# Patient Record
Sex: Female | Born: 1985 | Race: White | Hispanic: No | Marital: Single | Smoking: Current some day smoker
Health system: Southern US, Community
[De-identification: ages and names within clinical notes are randomized; demographics above are authoritative.]

## PROBLEM LIST (undated history)

## (undated) DIAGNOSIS — B1920 Unspecified viral hepatitis C without hepatic coma: Secondary | ICD-10-CM

## (undated) DIAGNOSIS — K219 Gastro-esophageal reflux disease without esophagitis: Secondary | ICD-10-CM

## (undated) DIAGNOSIS — IMO0001 Reserved for inherently not codable concepts without codable children: Secondary | ICD-10-CM

## (undated) DIAGNOSIS — M7918 Myalgia, other site: Secondary | ICD-10-CM

## (undated) DIAGNOSIS — N200 Calculus of kidney: Secondary | ICD-10-CM

## (undated) DIAGNOSIS — S329XXA Fracture of unspecified parts of lumbosacral spine and pelvis, initial encounter for closed fracture: Secondary | ICD-10-CM

## (undated) DIAGNOSIS — F419 Anxiety disorder, unspecified: Secondary | ICD-10-CM

## (undated) DIAGNOSIS — S42301A Unspecified fracture of shaft of humerus, right arm, initial encounter for closed fracture: Secondary | ICD-10-CM

## (undated) DIAGNOSIS — C539 Malignant neoplasm of cervix uteri, unspecified: Secondary | ICD-10-CM

## (undated) DIAGNOSIS — G510 Bell's palsy: Secondary | ICD-10-CM

## (undated) DIAGNOSIS — N390 Urinary tract infection, site not specified: Secondary | ICD-10-CM

## (undated) DIAGNOSIS — Z5189 Encounter for other specified aftercare: Secondary | ICD-10-CM

## (undated) DIAGNOSIS — S31809A Unspecified open wound of unspecified buttock, initial encounter: Secondary | ICD-10-CM

## (undated) HISTORY — PX: HUMERUS FRACTURE SURGERY: SHX670

## (undated) HISTORY — PX: FRACTURE SURGERY: SHX138

## (undated) HISTORY — PX: BONY PELVIS SURGERY: SHX572

## (undated) HISTORY — PX: HIP SURGERY: SHX245

## (undated) HISTORY — PX: ANKLE ARTHROPLASTY: SUR68

## (undated) HISTORY — DX: Unspecified viral hepatitis C without hepatic coma: B19.20

---
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
## 2006-09-14 ENCOUNTER — Encounter: Payer: Self-pay | Admitting: Gynecologic Oncology

## 2006-09-14 ENCOUNTER — Ambulatory Visit: Admission: RE | Admit: 2006-09-14 | Discharge: 2006-09-14 | Payer: Self-pay | Admitting: Gynecologic Oncology

## 2006-09-14 ENCOUNTER — Other Ambulatory Visit: Admission: RE | Admit: 2006-09-14 | Discharge: 2006-09-14 | Payer: Self-pay | Admitting: Gynecologic Oncology

## 2007-06-10 ENCOUNTER — Emergency Department (HOSPITAL_COMMUNITY): Admission: EM | Admit: 2007-06-10 | Discharge: 2007-06-10 | Payer: Self-pay | Admitting: Emergency Medicine

## 2007-06-13 ENCOUNTER — Emergency Department (HOSPITAL_COMMUNITY): Admission: EM | Admit: 2007-06-13 | Discharge: 2007-06-13 | Payer: Self-pay | Admitting: Emergency Medicine

## 2007-12-19 ENCOUNTER — Emergency Department (HOSPITAL_COMMUNITY): Admission: EM | Admit: 2007-12-19 | Discharge: 2007-12-19 | Payer: Self-pay | Admitting: Emergency Medicine

## 2008-03-30 DIAGNOSIS — C539 Malignant neoplasm of cervix uteri, unspecified: Secondary | ICD-10-CM

## 2008-03-30 HISTORY — PX: ABDOMINAL HYSTERECTOMY: SHX81

## 2008-03-30 HISTORY — DX: Malignant neoplasm of cervix uteri, unspecified: C53.9

## 2008-05-21 ENCOUNTER — Emergency Department (HOSPITAL_COMMUNITY): Admission: EM | Admit: 2008-05-21 | Discharge: 2008-05-21 | Payer: Self-pay | Admitting: Emergency Medicine

## 2008-05-21 ENCOUNTER — Encounter: Payer: Self-pay | Admitting: Orthopedic Surgery

## 2008-05-26 ENCOUNTER — Emergency Department (HOSPITAL_COMMUNITY): Admission: EM | Admit: 2008-05-26 | Discharge: 2008-05-26 | Payer: Self-pay | Admitting: Emergency Medicine

## 2008-05-28 ENCOUNTER — Ambulatory Visit: Payer: Self-pay | Admitting: Orthopedic Surgery

## 2008-05-28 DIAGNOSIS — S3210XA Unspecified fracture of sacrum, initial encounter for closed fracture: Secondary | ICD-10-CM | POA: Insufficient documentation

## 2008-05-28 DIAGNOSIS — S322XXA Fracture of coccyx, initial encounter for closed fracture: Secondary | ICD-10-CM

## 2008-06-11 ENCOUNTER — Telehealth: Payer: Self-pay | Admitting: Orthopedic Surgery

## 2008-06-18 ENCOUNTER — Emergency Department (HOSPITAL_COMMUNITY): Admission: EM | Admit: 2008-06-18 | Discharge: 2008-06-18 | Payer: Self-pay | Admitting: Emergency Medicine

## 2008-07-17 ENCOUNTER — Encounter (INDEPENDENT_AMBULATORY_CARE_PROVIDER_SITE_OTHER): Payer: Self-pay | Admitting: Unknown Physician Specialty

## 2008-07-17 ENCOUNTER — Other Ambulatory Visit: Admission: RE | Admit: 2008-07-17 | Discharge: 2008-07-17 | Payer: Self-pay | Admitting: Unknown Physician Specialty

## 2008-07-30 ENCOUNTER — Telehealth: Payer: Self-pay | Admitting: Orthopedic Surgery

## 2008-07-30 ENCOUNTER — Emergency Department (HOSPITAL_COMMUNITY): Admission: EM | Admit: 2008-07-30 | Discharge: 2008-07-30 | Payer: Self-pay | Admitting: Emergency Medicine

## 2008-08-01 ENCOUNTER — Ambulatory Visit: Payer: Self-pay | Admitting: Orthopedic Surgery

## 2008-08-06 ENCOUNTER — Encounter (INDEPENDENT_AMBULATORY_CARE_PROVIDER_SITE_OTHER): Payer: Self-pay | Admitting: *Deleted

## 2008-09-18 ENCOUNTER — Encounter: Payer: Self-pay | Admitting: Orthopedic Surgery

## 2009-03-07 ENCOUNTER — Ambulatory Visit (HOSPITAL_COMMUNITY): Admission: RE | Admit: 2009-03-07 | Discharge: 2009-03-08 | Payer: Self-pay | Admitting: Obstetrics and Gynecology

## 2009-03-07 ENCOUNTER — Encounter: Payer: Self-pay | Admitting: Obstetrics and Gynecology

## 2009-04-12 ENCOUNTER — Emergency Department (HOSPITAL_COMMUNITY): Admission: EM | Admit: 2009-04-12 | Discharge: 2009-04-13 | Payer: Self-pay | Admitting: Emergency Medicine

## 2009-05-28 ENCOUNTER — Emergency Department (HOSPITAL_COMMUNITY): Admission: EM | Admit: 2009-05-28 | Discharge: 2009-05-28 | Payer: Self-pay | Admitting: Emergency Medicine

## 2009-11-04 ENCOUNTER — Emergency Department (HOSPITAL_COMMUNITY): Admission: EM | Admit: 2009-11-04 | Discharge: 2009-11-05 | Payer: Self-pay | Admitting: Emergency Medicine

## 2010-04-29 NOTE — Assessment & Plan Note (Signed)
Summary: [facility] FX COCCYX/XR THERE/AWARE TO PAY $100/BSF   Vital Signs:  Patient Profile:   25 Years Old Female Weight:      133 pounds Pulse rate:   88 / minute Resp:     18 per minute  Vitals Entered By: Fuller Canada MD (May 28, 2008 1:53 PM)                 Chief Complaint:  pain in tail bone.  History of Present Illness: I saw [name] in the office today for an initial visit.  She is a 25 years old woman with the complaint of:  pain in coccyx area after falling on 05/21/08., she fell down steps. Xrays [facility] 05/21/08.  Medication oxycodone 5/325 mg and Ibuprofen 800mg  two times a day  Has alot of pain and pressure in butt area.  Hurts to sit and stand.  bowel movement hurt.         Updated Prior Medication List: prilosec albuterol inhaler oxycodone Current Allergies: ! * ALEVE ! * EXCEDRIN ! FLAGYL  Past Medical History:    cervical cancer    asthma    acid reflux  Past Surgical History:    NA   Family History:    FH of Cancer:     Hx, family, asthma  Social History:    Patient is single.     unemployed   Risk Factors:  Tobacco use:  current    Cigarettes:  Yes -- 1 pack(s) per day Caffeine use:  2 drinks per day Alcohol use:  yes    Comments:  occasional   Review of Systems  General      Denies weight loss, weight gain, fever, chills, and fatigue.  Cardiac      Denies chest pain, angina, heart attack, heart failure, poor circulation, blood clots, and phlebitis.  Resp      Denies short of breath, difficulty breathing, COPD, cough, and pneumonia.  GI      Complains of nausea, vomiting, diarrhea, and reflux.      Denies constipation, difficulty swallowing, ulcers, and GERD.  GU      Denies kidney failure, kidney transplant, kidney stones, burning, poor stream, testicular cancer, blood in urine, and .  Neuro      Complains of numbness.      Denies headache, dizziness, migraines, weakness, tremor, and unsteady walking.   MS      Denies joint pain, rheumatoid arthritis, joint swelling, gout, bone cancer, osteoporosis, and .  Endo      Denies thyroid disease, goiter, and diabetes.  Psych      Complains of anxiety.      Denies depression, mood swings, panic attack, bipolar, and schizophrenia.  Derm      Denies eczema, cancer, and itching.  EENT      Denies poor vision, cataracts, glaucoma, poor hearing, vertigo, ears ringing, sinusitis, hoarseness, toothaches, and bleeding gums.  Immunology      Denies seasonal allergies, sinus problems, and allergic to bee stings.  Lymphatic      Denies lymph node cancer and lymph edema.   Physical Exam  Constitutional: normal appearance   CDV: normal pulse and perfusion  Skin: normal  Neuro: normal sensation  Psyche: awake and alert  MSK:  normal motor function in the lower extremities, normal strength, normal range of motion, no tenderness, all joints are stable  Tenderness in the lower part of the back and in the tailbone area.  Medications Added to Medication List This Visit: 1)  Oxycodone-acetaminophen 5-325 Mg Tabs (Oxycodone-acetaminophen) .... One by mouth q 4 hrs as needed pain 2)  Percocet 5-325 Mg Tabs (Oxycodone-acetaminophen) .Marland Kitchen.. 1 by mouth q 4 as needed pain   Patient Instructions: 1)  Take the percocet for 2 weeks then lorcet until pain resides  2)  probably take 8 weeks 3)  Get donut cushion from [facility] to sit on    Prescriptions: PERCOCET 5-325 MG TABS (OXYCODONE-ACETAMINOPHEN) 1 by mouth q 4 as needed pain  #84 x 0   Entered and Authorized by:   Fuller Canada MD   Signed by:   Fuller Canada MD on 05/28/2008   Method used:   Print then Give to Patient   RxID:   [id number]

## 2010-04-29 NOTE — Letter (Signed)
Summary: Historic Patient File  Historic Patient File   Imported By: Elvera Maria 05/29/2008 09:35:59  _____________________________________________________________________  External Attachment:    Type:   Image     Comment:   history form

## 2010-04-29 NOTE — Progress Notes (Signed)
Summary: wants Lorcet prescription  Phone Note Call from Patient   Complaint: Urinary/GYN Problems Summary of Call: [name] (06-28-1985) has finished the percocet prescription and needs one for Lorcet.  Uses [facility] Her # is [phone] Initial call taken by: Jacklynn Ganong,  June 11, 2008 9:34 AM  Follow-up for Phone Call        lorcet plus 1 q 4 as needed pain # 60 2 refills  Follow-up by: Fuller Canada MD,  June 11, 2008 9:36 AM  Additional Follow-up for Phone Call Additional follow up Details #1::        sent to [facility] to call to pharmacy Additional Follow-up by: Jacklynn Ganong,  June 11, 2008 9:47 AM    New/Updated Medications: LORCET PLUS 7.5-650 MG TABS (HYDROCODONE-ACETAMINOPHEN) one by mouth q 4 hrs as needed pain   Prescriptions: LORCET PLUS 7.5-650 MG TABS (HYDROCODONE-ACETAMINOPHEN) one by mouth q 4 hrs as needed pain  #60 x 2   Entered by:   Ether Griffins   Authorized by:   Fuller Canada MD   Signed by:   Ether Griffins on 07/09/2008   Method used:   Telephoned to ...         RxID:   [id number]

## 2010-04-29 NOTE — Letter (Signed)
Summary: Certified Letter Unclaimed  Certified Letter Unclaimed   Imported By: Cammie Sickle 09/20/2008 19:44:01  _____________________________________________________________________  External Attachment:    Type:   Image     Comment:   External Document

## 2010-04-29 NOTE — Progress Notes (Signed)
Summary: wants you to change her pain medicine  Phone Note Call from Patient   Summary of Call: Kennia Klosinski went to [facility] this am for the tailbone pain, says it has gotten worse.  Said the ER doctor would not give her anything for pain, told her to see you.  she has an appointment  here this Wednesday.  She wants to know if you will give her something other than the hydrocodone 7.5/650, says it does not help the pain at all.  Uses [facility]. Her # is [phone] Initial call taken by: Jacklynn Ganong,  Jul 30, 2008 2:55 PM  Follow-up for Phone Call        no  Follow-up by: Fuller Canada MD,  Jul 30, 2008 3:06 PM  Additional Follow-up for Phone Call Additional follow up Details #1::        advised patient. Additional Follow-up by: Cammie Sickle,  Jul 30, 2008 5:12 PM

## 2010-04-29 NOTE — Assessment & Plan Note (Signed)
Summary: [facility] er tail bone pain/self pay/bsf   History of Present Illness: I saw [name] in the office today for a followup visit.  She is a 25 years old woman with the complaint of:  tailbone pain.  05/21/08 fell down steps  She was treated with usual measures of pain medication and education.  She fell again and went to the emergency room a third x-rays taken of her hip and pelvis were normal.  She was placed on Robaxin and prednisone one twice a day no pain medicines were given.  Obviously he has been almost 3 months.  She continues to complain of pain and now the pain is worse in the Lorcet Plus does not help.  She also fell again in April.    HISTORY I saw [name] in the office today for an initial visit.  She is a 25 years old woman with the complaint of:  pain in coccyx area after falling on 05/21/08., she fell down steps. Xrays [facility] 05/21/08.  Medication oxycodone 5/325 mg and Ibuprofen 800mg  two times a day  Has alot of pain and pressure in butt area.  Hurts to sit and stand.  bowel movement hurt.       Current Medications (verified): 1)  Lorcet Plus 7.5-650 Mg Tabs (Hydrocodone-Acetaminophen) .... One By Mouth Q 4 Hrs As Needed Pain 2)  Robaxin 500 Mg Tabs (Methocarbamol) .... 2 By Mouth Bid 3)  Dexamethasone 4 Mg Tabs (Dexamethasone) .... One By Mouth Bid 4)  Robaxin 500 Mg Tabs (Methocarbamol) .Marland Kitchen.. 1 By Mouth Q 6 As Needed Pain or Spasms 5)  Ultracet 37.5-325 Mg Tabs (Tramadol-Acetaminophen) .Marland Kitchen.. 1-2 By Mouth Q 4 As Needed Pain  Allergies (verified): 1)  ! * Aleve 2)  ! * Excedrin 3)  ! Flagyl  Past History:  Past Medical History:    cervical cancer    asthma    acid reflux (05/28/2008)  Past Surgical History:    NA (05/28/2008)  Family History:    FH of Cancer:     Hx, family, asthma     (05/28/2008)  Social History:    Patient is single.     unemployed (05/28/2008)  Risk Factors:    Alcohol Use: N/A    >5 drinks/d w/in last 3 months: N/A     Caffeine Use: 2 (05/28/2008)    Diet: N/A    Exercise: N/A  Risk Factors:    Smoking Status: current (05/28/2008)    Packs/Day: 1 (05/28/2008)    Cigars/wk: N/A    Pipe Use/wk: N/A    Cans of tobacco/wk: N/A    Passive Smoke Exposure: N/A  Review of Systems Neuro:  See HPI. MS:  See HPI.  Physical Exam  Additional Exam:  Examination reveals a well-developed well-nourished female grooming and hygiene are normal body habitus small to medium  Spinal tenderness is noted in the lumbosacral region and also at the tailbone as well as somewhat in the thoracic region.  There is no deformity or step-off.  Neurovascular exam is intact.   Impression & Recommendations:  Problem # 1:  FRACTURE, COCCYX (ICD-805.6) Assessment Comment Only  Assessment:  I reviewed her regional injury x-ray and her new x-rays and there is a flexion position of the coccyx normal hip and pelvic film otherwise.  I don't think she needs any more pain medication that his narcotic.  She can take Robaxin and Ultracet.  She has chronic pain we offered her the pain clinic she denied need  for pain clinic and requested half of her payment back.  We gave her that back and we are discharging her from the practice.  Appropriate level be forwarded to her home address.  Orders: Pain Clinic Referral (Pain) Est. Patient Level II ([address])  Medications Added to Medication List This Visit: 1)  Robaxin 500 Mg Tabs (Methocarbamol) .... 2 by mouth bid 2)  Dexamethasone 4 Mg Tabs (Dexamethasone) .... One by mouth bid 3)  Robaxin 500 Mg Tabs (Methocarbamol) .Marland Kitchen.. 1 by mouth q 6 as needed pain or spasms 4)  Ultracet 37.5-325 Mg Tabs (Tramadol-acetaminophen) .Marland Kitchen.. 1-2 by mouth q 4 as needed pain  Patient Instructions: 1)  Pain clinic referral for chronic pain 2)  Stop prednisone  3)  Continue robaxin, and you can take ultracet for pain  Prescriptions: ULTRACET 37.5-325 MG TABS (TRAMADOL-ACETAMINOPHEN) 1-2 by mouth q 4 as needed  pain  #60 x 3   Entered and Authorized by:   Fuller Canada MD   Signed by:   Fuller Canada MD on 08/01/2008   Method used:   Print then Give to Patient   RxID:   [id number] ROBAXIN 500 MG TABS (METHOCARBAMOL) 1 by mouth q 6 as needed pain or spasms  #60 x 1   Entered and Authorized by:   Fuller Canada MD   Signed by:   Fuller Canada MD on 08/01/2008   Method used:   Print then Give to Patient   RxID:   [id number]

## 2010-06-25 ENCOUNTER — Encounter (HOSPITAL_COMMUNITY): Payer: Self-pay | Admitting: Radiology

## 2010-06-25 ENCOUNTER — Inpatient Hospital Stay (HOSPITAL_COMMUNITY)
Admission: EM | Admit: 2010-06-25 | Discharge: 2010-08-04 | DRG: 958 | Disposition: A | Discharge status: Home Health | Payer: No Typology Code available for payment source | Attending: General Surgery | Admitting: General Surgery

## 2010-06-25 ENCOUNTER — Emergency Department (HOSPITAL_COMMUNITY): Payer: No Typology Code available for payment source

## 2010-06-25 DIAGNOSIS — S85309A Unspecified injury of greater saphenous vein at lower leg level, unspecified leg, initial encounter: Secondary | ICD-10-CM | POA: Diagnosis present

## 2010-06-25 DIAGNOSIS — I959 Hypotension, unspecified: Secondary | ICD-10-CM | POA: Diagnosis present

## 2010-06-25 DIAGNOSIS — S31809A Unspecified open wound of unspecified buttock, initial encounter: Secondary | ICD-10-CM | POA: Diagnosis present

## 2010-06-25 DIAGNOSIS — S32509A Unspecified fracture of unspecified pubis, initial encounter for closed fracture: Secondary | ICD-10-CM | POA: Diagnosis present

## 2010-06-25 DIAGNOSIS — Y849 Medical procedure, unspecified as the cause of abnormal reaction of the patient, or of later complication, without mention of misadventure at the time of the procedure: Secondary | ICD-10-CM | POA: Diagnosis present

## 2010-06-25 DIAGNOSIS — S71109A Unspecified open wound, unspecified thigh, initial encounter: Secondary | ICD-10-CM | POA: Diagnosis present

## 2010-06-25 DIAGNOSIS — S8253XA Displaced fracture of medial malleolus of unspecified tibia, initial encounter for closed fracture: Secondary | ICD-10-CM | POA: Diagnosis present

## 2010-06-25 DIAGNOSIS — R791 Abnormal coagulation profile: Secondary | ICD-10-CM | POA: Diagnosis present

## 2010-06-25 DIAGNOSIS — S32309B Unspecified fracture of unspecified ilium, initial encounter for open fracture: Secondary | ICD-10-CM | POA: Diagnosis present

## 2010-06-25 DIAGNOSIS — E876 Hypokalemia: Secondary | ICD-10-CM | POA: Diagnosis not present

## 2010-06-25 DIAGNOSIS — M24529 Contracture, unspecified elbow: Secondary | ICD-10-CM | POA: Diagnosis present

## 2010-06-25 DIAGNOSIS — N390 Urinary tract infection, site not specified: Secondary | ICD-10-CM | POA: Diagnosis not present

## 2010-06-25 DIAGNOSIS — S300XXA Contusion of lower back and pelvis, initial encounter: Secondary | ICD-10-CM | POA: Diagnosis present

## 2010-06-25 DIAGNOSIS — K5900 Constipation, unspecified: Secondary | ICD-10-CM | POA: Diagnosis not present

## 2010-06-25 DIAGNOSIS — S42309A Unspecified fracture of shaft of humerus, unspecified arm, initial encounter for closed fracture: Secondary | ICD-10-CM | POA: Diagnosis present

## 2010-06-25 DIAGNOSIS — S36113A Laceration of liver, unspecified degree, initial encounter: Principal | ICD-10-CM | POA: Diagnosis present

## 2010-06-25 DIAGNOSIS — S75109A Unspecified injury of femoral vein at hip and thigh level, unspecified leg, initial encounter: Secondary | ICD-10-CM | POA: Diagnosis present

## 2010-06-25 DIAGNOSIS — J45909 Unspecified asthma, uncomplicated: Secondary | ICD-10-CM | POA: Diagnosis present

## 2010-06-25 DIAGNOSIS — Y9210 Unspecified residential institution as the place of occurrence of the external cause: Secondary | ICD-10-CM | POA: Diagnosis present

## 2010-06-25 DIAGNOSIS — F411 Generalized anxiety disorder: Secondary | ICD-10-CM | POA: Diagnosis present

## 2010-06-25 DIAGNOSIS — F17200 Nicotine dependence, unspecified, uncomplicated: Secondary | ICD-10-CM | POA: Diagnosis present

## 2010-06-25 DIAGNOSIS — D62 Acute posthemorrhagic anemia: Secondary | ICD-10-CM | POA: Diagnosis present

## 2010-06-25 DIAGNOSIS — S7000XA Contusion of unspecified hip, initial encounter: Secondary | ICD-10-CM | POA: Diagnosis present

## 2010-06-25 DIAGNOSIS — IMO0002 Reserved for concepts with insufficient information to code with codable children: Secondary | ICD-10-CM | POA: Diagnosis present

## 2010-06-25 DIAGNOSIS — S42409A Unspecified fracture of lower end of unspecified humerus, initial encounter for closed fracture: Secondary | ICD-10-CM | POA: Diagnosis present

## 2010-06-25 DIAGNOSIS — S42413A Displaced simple supracondylar fracture without intercondylar fracture of unspecified humerus, initial encounter for closed fracture: Secondary | ICD-10-CM | POA: Diagnosis present

## 2010-06-25 DIAGNOSIS — Y998 Other external cause status: Secondary | ICD-10-CM

## 2010-06-25 DIAGNOSIS — S71009A Unspecified open wound, unspecified hip, initial encounter: Secondary | ICD-10-CM | POA: Diagnosis present

## 2010-06-25 LAB — POCT I-STAT, CHEM 8
BUN: 4 mg/dL — ABNORMAL LOW (ref 6–23)
Calcium, Ion: 0.86 mmol/L — ABNORMAL LOW (ref 1.12–1.32)
Chloride: 109 mEq/L (ref 96–112)
Creatinine, Ser: 0.9 mg/dL (ref 0.4–1.2)
Glucose, Bld: 122 mg/dL — ABNORMAL HIGH (ref 70–99)
HCT: 37 % (ref 36.0–46.0)
Hemoglobin: 12.6 g/dL (ref 12.0–15.0)
Potassium: 3.6 mEq/L (ref 3.5–5.1)
Sodium: 141 mEq/L (ref 135–145)
TCO2: 15 mmol/L (ref 0–100)

## 2010-06-25 LAB — COMPREHENSIVE METABOLIC PANEL
ALT: 84 U/L — ABNORMAL HIGH (ref 0–35)
AST: 136 U/L — ABNORMAL HIGH (ref 0–37)
Albumin: 2.5 g/dL — ABNORMAL LOW (ref 3.5–5.2)
Alkaline Phosphatase: 42 U/L (ref 39–117)
BUN: 5 mg/dL — ABNORMAL LOW (ref 6–23)
CO2: 23 meq/L (ref 19–32)
Calcium: 6.6 mg/dL — ABNORMAL LOW (ref 8.4–10.5)
Chloride: 115 mEq/L — ABNORMAL HIGH (ref 96–112)
Creatinine, Ser: 0.63 mg/dL (ref 0.4–1.2)
GFR calc Af Amer: >60 mL/min (ref >60)
GFR calc non Af Amer: >60 mL/min (ref >60)
Glucose, Bld: 130 mg/dL — ABNORMAL HIGH (ref 70–99)
Potassium: 3.9 mEq/L (ref 3.5–5.1)
Sodium: 143 mEq/L (ref 135–145)
Total Bilirubin: 0.2 mg/dL — ABNORMAL LOW (ref 0.3–1.2)
Total Protein: 4.3 g/dL — ABNORMAL LOW (ref 6.0–8.3)

## 2010-06-25 LAB — DIFFERENTIAL
Basophils Absolute: 0 10*3/uL (ref 0.0–0.1)
Basophils Relative: 0 % (ref 0–1)
Eosinophils Absolute: 0.4 10*3/uL (ref 0.0–0.7)
Eosinophils Relative: 2 % (ref 0–5)
Lymphocytes Relative: 26 % (ref 12–46)
Lymphs Abs: 5.4 10*3/uL — ABNORMAL HIGH (ref 0.7–4.0)
Monocytes Absolute: 0.8 10*3/uL (ref 0.1–1.0)
Monocytes Relative: 4 % (ref 3–12)
Neutro Abs: 14 10*3/uL — ABNORMAL HIGH (ref 1.7–7.7)
Neutrophils Relative %: 68 % (ref 43–77)

## 2010-06-25 LAB — POCT I-STAT 4, (NA,K, GLUC, HGB,HCT)
Glucose, Bld: 125 mg/dL — ABNORMAL HIGH (ref 70–99)
HCT: 27 % — ABNORMAL LOW (ref 36.0–46.0)
Hemoglobin: 9.2 g/dL — ABNORMAL LOW (ref 12.0–15.0)
Potassium: 4.2 meq/L (ref 3.5–5.1)
Sodium: 146 mEq/L — ABNORMAL HIGH (ref 135–145)

## 2010-06-25 LAB — ABO/RH: ABO/RH(D): B POS

## 2010-06-25 LAB — ETHANOL: Alcohol, Ethyl (B): 57 mg/dL — ABNORMAL HIGH (ref 0–10)

## 2010-06-25 LAB — CBC
HCT: 36.6 % (ref 36.0–46.0)
Hemoglobin: 12.4 g/dL (ref 12.0–15.0)
MCH: 32.7 pg (ref 26.0–34.0)
MCHC: 33.9 g/dL (ref 30.0–36.0)
MCV: 96.6 fL (ref 78.0–100.0)
Platelets: 272 10*3/uL (ref 150–400)
RBC: 3.79 MIL/uL — ABNORMAL LOW (ref 3.87–5.11)
RDW: 12.7 % (ref 11.5–15.5)
WBC: 20.6 10*3/uL — ABNORMAL HIGH (ref 4.0–10.5)

## 2010-06-25 LAB — LACTIC ACID, PLASMA: Lactic Acid, Venous: 3 mmol/L — ABNORMAL HIGH (ref 0.5–2.2)

## 2010-06-25 MED ORDER — IOHEXOL 300 MG/ML  SOLN
100.0000 mL | Freq: Once | INTRAMUSCULAR | Status: AC | PRN
  Administered 2010-06-25: 100 mL via INTRAVENOUS

## 2010-06-26 ENCOUNTER — Inpatient Hospital Stay (HOSPITAL_COMMUNITY): Payer: No Typology Code available for payment source

## 2010-06-26 LAB — BASIC METABOLIC PANEL
BUN: 6 mg/dL (ref 6–23)
CO2: 24 mEq/L (ref 19–32)
Calcium: 7.1 mg/dL — ABNORMAL LOW (ref 8.4–10.5)
Chloride: 113 mEq/L — ABNORMAL HIGH (ref 96–112)
Creatinine, Ser: 0.69 mg/dL (ref 0.4–1.2)
GFR calc Af Amer: >60 mL/min (ref >60)
GFR calc non Af Amer: >60 mL/min (ref >60)
Glucose, Bld: 139 mg/dL — ABNORMAL HIGH (ref 70–99)
Potassium: 3.8 mEq/L (ref 3.5–5.1)
Sodium: 144 mEq/L (ref 135–145)

## 2010-06-26 LAB — MRSA PCR SCREENING: MRSA by PCR: NEGATIVE

## 2010-06-26 LAB — CBC
HCT: 28.8 % — ABNORMAL LOW (ref 36.0–46.0)
HCT: 26.1 % — ABNORMAL LOW (ref 36.0–46.0)
Hemoglobin: 9.8 g/dL — ABNORMAL LOW (ref 12.0–15.0)
Hemoglobin: 8.8 g/dL — ABNORMAL LOW (ref 12.0–15.0)
MCH: 33.2 pg (ref 26.0–34.0)
MCH: 32.7 pg (ref 26.0–34.0)
MCHC: 34 g/dL (ref 30.0–36.0)
MCHC: 33.7 g/dL (ref 30.0–36.0)
MCV: 97.6 fL (ref 78.0–100.0)
MCV: 97 fL (ref 78.0–100.0)
Platelets: 194 10*3/uL (ref 150–400)
Platelets: 173 10*3/uL (ref 150–400)
RBC: 2.95 MIL/uL — ABNORMAL LOW (ref 3.87–5.11)
RBC: 2.69 MIL/uL — ABNORMAL LOW (ref 3.87–5.11)
RDW: 13.1 % (ref 11.5–15.5)
RDW: 13.2 % (ref 11.5–15.5)
WBC: 12.7 10*3/uL — ABNORMAL HIGH (ref 4.0–10.5)
WBC: 9.6 10*3/uL (ref 4.0–10.5)

## 2010-06-27 ENCOUNTER — Inpatient Hospital Stay (HOSPITAL_COMMUNITY): Payer: No Typology Code available for payment source

## 2010-06-27 DIAGNOSIS — S75009A Unspecified injury of femoral artery, unspecified leg, initial encounter: Secondary | ICD-10-CM

## 2010-06-27 LAB — BASIC METABOLIC PANEL
BUN: 2 mg/dL — ABNORMAL LOW (ref 6–23)
CO2: 25 mEq/L (ref 19–32)
Calcium: 7.6 mg/dL — ABNORMAL LOW (ref 8.4–10.5)
Chloride: 106 mEq/L (ref 96–112)
Creatinine, Ser: 0.53 mg/dL (ref 0.4–1.2)
GFR calc Af Amer: >60 mL/min (ref >60)
GFR calc non Af Amer: >60 mL/min (ref >60)
Glucose, Bld: 98 mg/dL (ref 70–99)
Potassium: 3.9 mEq/L (ref 3.5–5.1)
Sodium: 137 mEq/L (ref 135–145)

## 2010-06-27 LAB — CBC
HCT: 26.7 % — ABNORMAL LOW (ref 36.0–46.0)
HCT: 22.8 % — ABNORMAL LOW (ref 36.0–46.0)
Hemoglobin: 9 g/dL — ABNORMAL LOW (ref 12.0–15.0)
Hemoglobin: 7.7 g/dL — ABNORMAL LOW (ref 12.0–15.0)
MCH: 32.6 pg (ref 26.0–34.0)
MCH: 31.4 pg (ref 26.0–34.0)
MCHC: 33.7 g/dL (ref 30.0–36.0)
MCHC: 33.8 g/dL (ref 30.0–36.0)
MCV: 96.7 fL (ref 78.0–100.0)
MCV: 93.1 fL (ref 78.0–100.0)
Platelets: 189 10*3/uL (ref 150–400)
Platelets: 130 10*3/uL — ABNORMAL LOW (ref 150–400)
RBC: 2.76 MIL/uL — ABNORMAL LOW (ref 3.87–5.11)
RBC: 2.45 MIL/uL — ABNORMAL LOW (ref 3.87–5.11)
RDW: 13.1 % (ref 11.5–15.5)
RDW: 14.3 % (ref 11.5–15.5)
WBC: 10.9 10*3/uL — ABNORMAL HIGH (ref 4.0–10.5)
WBC: 12.4 10*3/uL — ABNORMAL HIGH (ref 4.0–10.5)

## 2010-06-28 LAB — CBC
HCT: 19 % — ABNORMAL LOW (ref 36.0–46.0)
HCT: 26.9 % — ABNORMAL LOW (ref 36.0–46.0)
Hemoglobin: 6.6 g/dL — CL (ref 12.0–15.0)
Hemoglobin: 9.2 g/dL — ABNORMAL LOW (ref 12.0–15.0)
MCH: 31.9 pg (ref 26.0–34.0)
MCH: 30.8 pg (ref 26.0–34.0)
MCHC: 34.7 g/dL (ref 30.0–36.0)
MCHC: 34.2 g/dL (ref 30.0–36.0)
MCV: 91.8 fL (ref 78.0–100.0)
MCV: 90 fL (ref 78.0–100.0)
Platelets: 122 10*3/uL — ABNORMAL LOW (ref 150–400)
Platelets: 126 10*3/uL — ABNORMAL LOW (ref 150–400)
RBC: 2.07 MIL/uL — ABNORMAL LOW (ref 3.87–5.11)
RBC: 2.99 MIL/uL — ABNORMAL LOW (ref 3.87–5.11)
RDW: 14 % (ref 11.5–15.5)
RDW: 14.6 % (ref 11.5–15.5)
WBC: 10.6 10*3/uL — ABNORMAL HIGH (ref 4.0–10.5)
WBC: 12.4 10*3/uL — ABNORMAL HIGH (ref 4.0–10.5)

## 2010-06-28 LAB — BASIC METABOLIC PANEL
BUN: <1 mg/dL — ABNORMAL LOW (ref 6–23)
CO2: 30 mEq/L (ref 19–32)
Calcium: 6.8 mg/dL — ABNORMAL LOW (ref 8.4–10.5)
Chloride: 99 mEq/L (ref 96–112)
Creatinine, Ser: 0.43 mg/dL (ref 0.4–1.2)
GFR calc Af Amer: >60 mL/min (ref >60)
GFR calc non Af Amer: >60 mL/min (ref >60)
Glucose, Bld: 120 mg/dL — ABNORMAL HIGH (ref 70–99)
Potassium: 3.8 mEq/L (ref 3.5–5.1)
Sodium: 133 mEq/L — ABNORMAL LOW (ref 135–145)

## 2010-06-28 LAB — PREPARE RBC (CROSSMATCH)

## 2010-06-29 LAB — TYPE AND SCREEN
ABO/RH(D): B POS
Antibody Screen: NEGATIVE
Unit division: 0
Unit division: 0
Unit division: 0
Unit division: 0
Unit division: 0

## 2010-06-29 LAB — BASIC METABOLIC PANEL
BUN: <1 mg/dL — ABNORMAL LOW (ref 6–23)
CO2: 29 mEq/L (ref 19–32)
Calcium: 7.2 mg/dL — ABNORMAL LOW (ref 8.4–10.5)
Chloride: 99 mEq/L (ref 96–112)
Creatinine, Ser: 0.39 mg/dL — ABNORMAL LOW (ref 0.4–1.2)
GFR calc Af Amer: >60 mL/min (ref >60)
GFR calc non Af Amer: >60 mL/min (ref >60)
Glucose, Bld: 88 mg/dL (ref 70–99)
Potassium: 3.6 mEq/L (ref 3.5–5.1)
Sodium: 134 mEq/L — ABNORMAL LOW (ref 135–145)

## 2010-06-29 LAB — CBC
HCT: 25.3 % — ABNORMAL LOW (ref 36.0–46.0)
HCT: 28.1 % — ABNORMAL LOW (ref 36.0–46.0)
Hemoglobin: 8.7 g/dL — ABNORMAL LOW (ref 12.0–15.0)
Hemoglobin: 9.7 g/dL — ABNORMAL LOW (ref 12.0–15.0)
MCH: 31.1 pg (ref 26.0–34.0)
MCH: 31.4 pg (ref 26.0–34.0)
MCHC: 34.4 g/dL (ref 30.0–36.0)
MCHC: 34.5 g/dL (ref 30.0–36.0)
MCV: 90.4 fL (ref 78.0–100.0)
MCV: 90.9 fL (ref 78.0–100.0)
Platelets: 132 10*3/uL — ABNORMAL LOW (ref 150–400)
Platelets: 148 10*3/uL — ABNORMAL LOW (ref 150–400)
RBC: 2.8 MIL/uL — ABNORMAL LOW (ref 3.87–5.11)
RBC: 3.09 MIL/uL — ABNORMAL LOW (ref 3.87–5.11)
RDW: 14.4 % (ref 11.5–15.5)
RDW: 14.3 % (ref 11.5–15.5)
WBC: 10.6 10*3/uL — ABNORMAL HIGH (ref 4.0–10.5)
WBC: 10.7 10*3/uL — ABNORMAL HIGH (ref 4.0–10.5)

## 2010-06-29 LAB — DIFFERENTIAL
Basophils Absolute: 0 10*3/uL (ref 0.0–0.1)
Basophils Relative: 0 % (ref 0–1)
Eosinophils Absolute: 0.4 10*3/uL (ref 0.0–0.7)
Eosinophils Relative: 4 % (ref 0–5)
Lymphocytes Relative: 14 % (ref 12–46)
Lymphs Abs: 1.5 10*3/uL (ref 0.7–4.0)
Monocytes Absolute: 1.1 10*3/uL — ABNORMAL HIGH (ref 0.1–1.0)
Monocytes Relative: 11 % (ref 3–12)
Neutro Abs: 7.6 10*3/uL (ref 1.7–7.7)
Neutrophils Relative %: 71 % (ref 43–77)

## 2010-06-30 ENCOUNTER — Inpatient Hospital Stay (HOSPITAL_COMMUNITY): Payer: No Typology Code available for payment source

## 2010-06-30 LAB — CBC
HCT: 26.2 % — ABNORMAL LOW (ref 36.0–46.0)
HCT: 28.3 % — ABNORMAL LOW (ref 36.0–46.0)
Hemoglobin: 9 g/dL — ABNORMAL LOW (ref 12.0–15.0)
Hemoglobin: 9.7 g/dL — ABNORMAL LOW (ref 12.0–15.0)
MCH: 31.3 pg (ref 26.0–34.0)
MCH: 31.1 pg (ref 26.0–34.0)
MCHC: 34.4 g/dL (ref 30.0–36.0)
MCHC: 34.3 g/dL (ref 30.0–36.0)
MCV: 91 fL (ref 78.0–100.0)
MCV: 90.7 fL (ref 78.0–100.0)
Platelets: 160 10*3/uL (ref 150–400)
Platelets: 184 10*3/uL (ref 150–400)
RBC: 2.88 MIL/uL — ABNORMAL LOW (ref 3.87–5.11)
RBC: 3.12 MIL/uL — ABNORMAL LOW (ref 3.87–5.11)
RDW: 14 % (ref 11.5–15.5)
RDW: 13.8 % (ref 11.5–15.5)
WBC: 9 10*3/uL (ref 4.0–10.5)
WBC: 9.2 10*3/uL (ref 4.0–10.5)
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
## 2010-07-01 ENCOUNTER — Inpatient Hospital Stay (HOSPITAL_COMMUNITY): Payer: No Typology Code available for payment source

## 2010-07-01 LAB — CBC
HCT: 29.5 % — ABNORMAL LOW (ref 36.0–46.0)
HCT: 37.4 % (ref 36.0–46.0)
HCT: 41.9 % (ref 36.0–46.0)
Hemoglobin: 9.9 g/dL — ABNORMAL LOW (ref 12.0–15.0)
Hemoglobin: 12.5 g/dL (ref 12.0–15.0)
Hemoglobin: 14.1 g/dL (ref 12.0–15.0)
MCH: 30.5 pg (ref 26.0–34.0)
MCHC: 33.6 g/dL (ref 30.0–36.0)
MCHC: 33.5 g/dL (ref 30.0–36.0)
MCHC: 33.7 g/dL (ref 30.0–36.0)
MCV: 90.8 fL (ref 78.0–100.0)
MCV: 96.9 fL (ref 78.0–100.0)
MCV: 97.8 fL (ref 78.0–100.0)
Platelets: 189 10*3/uL (ref 150–400)
Platelets: 201 10*3/uL (ref 150–400)
Platelets: 211 10*3/uL (ref 150–400)
RBC: 3.25 MIL/uL — ABNORMAL LOW (ref 3.87–5.11)
RBC: 3.83 MIL/uL — ABNORMAL LOW (ref 3.87–5.11)
RBC: 4.33 MIL/uL (ref 3.87–5.11)
RDW: 13.8 % (ref 11.5–15.5)
RDW: 13.7 % (ref 11.5–15.5)
RDW: 13.7 % (ref 11.5–15.5)
WBC: 8.7 10*3/uL (ref 4.0–10.5)
WBC: 10.1 10*3/uL (ref 4.0–10.5)
WBC: 9.2 10*3/uL (ref 4.0–10.5)

## 2010-07-01 LAB — DIFFERENTIAL
Basophils Absolute: 0 10*3/uL (ref 0.0–0.1)
Basophils Relative: 0 % (ref 0–1)
Eosinophils Absolute: 0.3 10*3/uL (ref 0.0–0.7)
Eosinophils Relative: 3 % (ref 0–5)
Lymphocytes Relative: 32 % (ref 12–46)
Lymphs Abs: 2.9 10*3/uL (ref 0.7–4.0)
Monocytes Absolute: 0.8 10*3/uL (ref 0.1–1.0)
Monocytes Relative: 9 % (ref 3–12)
Neutro Abs: 5.1 10*3/uL (ref 1.7–7.7)
Neutrophils Relative %: 56 % (ref 43–77)

## 2010-07-01 LAB — HCG, QUANTITATIVE, PREGNANCY: hCG, Beta Chain, Quant, S: <2 m[IU]/mL (ref <5)

## 2010-07-01 LAB — GRAM STAIN

## 2010-07-01 LAB — TYPE AND SCREEN
ABO/RH(D): B POS
Antibody Screen: NEGATIVE

## 2010-07-02 ENCOUNTER — Inpatient Hospital Stay (HOSPITAL_COMMUNITY): Payer: No Typology Code available for payment source

## 2010-07-02 LAB — BASIC METABOLIC PANEL
BUN: 2 mg/dL — ABNORMAL LOW (ref 6–23)
CO2: 28 mEq/L (ref 19–32)
Calcium: 6.9 mg/dL — ABNORMAL LOW (ref 8.4–10.5)
Chloride: 94 mEq/L — ABNORMAL LOW (ref 96–112)
Creatinine, Ser: 0.4 mg/dL (ref 0.4–1.2)
GFR calc Af Amer: >60 mL/min (ref >60)
GFR calc non Af Amer: >60 mL/min (ref >60)
Glucose, Bld: 120 mg/dL — ABNORMAL HIGH (ref 70–99)
Potassium: 3.3 mEq/L — ABNORMAL LOW (ref 3.5–5.1)
Sodium: 132 mEq/L — ABNORMAL LOW (ref 135–145)

## 2010-07-02 LAB — CBC
HCT: 23.3 % — ABNORMAL LOW (ref 36.0–46.0)
Hemoglobin: 8 g/dL — ABNORMAL LOW (ref 12.0–15.0)
MCH: 31 pg (ref 26.0–34.0)
MCHC: 34.3 g/dL (ref 30.0–36.0)
MCV: 90.3 fL (ref 78.0–100.0)
Platelets: 214 10*3/uL (ref 150–400)
RBC: 2.58 MIL/uL — ABNORMAL LOW (ref 3.87–5.11)
RDW: 13.7 % (ref 11.5–15.5)
WBC: 8.9 10*3/uL (ref 4.0–10.5)

## 2010-07-03 LAB — BASIC METABOLIC PANEL
BUN: 2 mg/dL — ABNORMAL LOW (ref 6–23)
CO2: 28 mEq/L (ref 19–32)
Calcium: 7.3 mg/dL — ABNORMAL LOW (ref 8.4–10.5)
Chloride: 99 mEq/L (ref 96–112)
Creatinine, Ser: 0.36 mg/dL — ABNORMAL LOW (ref 0.4–1.2)
GFR calc Af Amer: >60 mL/min (ref >60)
GFR calc non Af Amer: >60 mL/min (ref >60)
Glucose, Bld: 106 mg/dL — ABNORMAL HIGH (ref 70–99)
Potassium: 3.4 mEq/L — ABNORMAL LOW (ref 3.5–5.1)
Sodium: 134 mEq/L — ABNORMAL LOW (ref 135–145)

## 2010-07-03 LAB — CBC
HCT: 22.5 % — ABNORMAL LOW (ref 36.0–46.0)
Hemoglobin: 7.7 g/dL — ABNORMAL LOW (ref 12.0–15.0)
MCH: 31.6 pg (ref 26.0–34.0)
MCHC: 34.2 g/dL (ref 30.0–36.0)
MCV: 92.2 fL (ref 78.0–100.0)
Platelets: 263 10*3/uL (ref 150–400)
RBC: 2.44 MIL/uL — ABNORMAL LOW (ref 3.87–5.11)
RDW: 13.9 % (ref 11.5–15.5)
WBC: 10.5 10*3/uL (ref 4.0–10.5)

## 2010-07-03 LAB — SURGICAL PCR SCREEN
MRSA, PCR: NEGATIVE
Staphylococcus aureus: NEGATIVE

## 2010-07-03 LAB — PROTIME-INR
INR: 1.21 (ref 0.00–1.49)
Prothrombin Time: 15.5 seconds — ABNORMAL HIGH (ref 11.6–15.2)
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
## 2010-07-04 DIAGNOSIS — S42413A Displaced simple supracondylar fracture without intercondylar fracture of unspecified humerus, initial encounter for closed fracture: Secondary | ICD-10-CM

## 2010-07-04 DIAGNOSIS — S329XXA Fracture of unspecified parts of lumbosacral spine and pelvis, initial encounter for closed fracture: Secondary | ICD-10-CM

## 2010-07-04 DIAGNOSIS — S8253XA Displaced fracture of medial malleolus of unspecified tibia, initial encounter for closed fracture: Secondary | ICD-10-CM

## 2010-07-04 LAB — CBC
HCT: 22.4 % — ABNORMAL LOW (ref 36.0–46.0)
Hemoglobin: 7.5 g/dL — ABNORMAL LOW (ref 12.0–15.0)
MCH: 31.4 pg (ref 26.0–34.0)
MCHC: 33.5 g/dL (ref 30.0–36.0)
MCV: 93.7 fL (ref 78.0–100.0)
Platelets: 290 10*3/uL (ref 150–400)
RBC: 2.39 MIL/uL — ABNORMAL LOW (ref 3.87–5.11)
RDW: 14 % (ref 11.5–15.5)
WBC: 7.4 10*3/uL (ref 4.0–10.5)

## 2010-07-04 LAB — WOUND CULTURE: Culture: NO GROWTH

## 2010-07-04 LAB — PROTIME-INR
INR: 1.37 (ref 0.00–1.49)
Prothrombin Time: 17.1 s — ABNORMAL HIGH (ref 11.6–15.2)

## 2010-07-05 LAB — PROTIME-INR
INR: 3.55 — ABNORMAL HIGH (ref 0.00–1.49)
Prothrombin Time: 35.5 seconds — ABNORMAL HIGH (ref 11.6–15.2)

## 2010-07-06 LAB — CROSSMATCH
ABO/RH(D): B POS
Antibody Screen: NEGATIVE
Unit division: 0
Unit division: 0

## 2010-07-06 LAB — CBC
HCT: 26.9 % — ABNORMAL LOW (ref 36.0–46.0)
Hemoglobin: 8.9 g/dL — ABNORMAL LOW (ref 12.0–15.0)
MCH: 31.1 pg (ref 26.0–34.0)
MCHC: 33.1 g/dL (ref 30.0–36.0)
MCV: 94.1 fL (ref 78.0–100.0)
Platelets: 478 10*3/uL — ABNORMAL HIGH (ref 150–400)
RBC: 2.86 MIL/uL — ABNORMAL LOW (ref 3.87–5.11)
RDW: 14.2 % (ref 11.5–15.5)
WBC: 9.1 10*3/uL (ref 4.0–10.5)

## 2010-07-06 LAB — PROTIME-INR
INR: 3.37 — ABNORMAL HIGH (ref 0.00–1.49)
Prothrombin Time: 34.1 seconds — ABNORMAL HIGH (ref 11.6–15.2)
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
## 2010-07-07 LAB — ANAEROBIC CULTURE

## 2010-07-07 LAB — PROTIME-INR
INR: 1.98 — ABNORMAL HIGH (ref 0.00–1.49)
Prothrombin Time: 22.7 seconds — ABNORMAL HIGH (ref 11.6–15.2)
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
## 2010-07-08 LAB — PROTIME-INR
INR: 1.69 — ABNORMAL HIGH (ref 0.00–1.49)
Prothrombin Time: 20.1 seconds — ABNORMAL HIGH (ref 11.6–15.2)

## 2010-07-09 LAB — PROTIME-INR
INR: 1.25 (ref 0.00–1.49)
Prothrombin Time: 15.9 seconds — ABNORMAL HIGH (ref 11.6–15.2)
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
## 2010-07-10 LAB — PROTIME-INR
INR: 1.04 (ref 0.00–1.49)
Prothrombin Time: 13.8 seconds (ref 11.6–15.2)

## 2010-07-11 ENCOUNTER — Inpatient Hospital Stay (HOSPITAL_COMMUNITY): Payer: No Typology Code available for payment source

## 2010-07-11 LAB — PROTIME-INR
INR: 0.99 (ref 0.00–1.49)
Prothrombin Time: 13.3 seconds (ref 11.6–15.2)

## 2010-07-12 ENCOUNTER — Inpatient Hospital Stay (HOSPITAL_COMMUNITY): Payer: No Typology Code available for payment source

## 2010-07-12 LAB — BASIC METABOLIC PANEL
BUN: 6 mg/dL (ref 6–23)
CO2: 29 mEq/L (ref 19–32)
Calcium: 8.6 mg/dL (ref 8.4–10.5)
Chloride: 95 mEq/L — ABNORMAL LOW (ref 96–112)
Creatinine, Ser: 0.46 mg/dL (ref 0.4–1.2)
GFR calc Af Amer: >60 mL/min (ref >60)
GFR calc non Af Amer: >60 mL/min (ref >60)
Glucose, Bld: 111 mg/dL — ABNORMAL HIGH (ref 70–99)
Potassium: 3.9 mEq/L (ref 3.5–5.1)
Sodium: 133 mEq/L — ABNORMAL LOW (ref 135–145)

## 2010-07-12 LAB — CBC
HCT: 29.4 % — ABNORMAL LOW (ref 36.0–46.0)
Hemoglobin: 9.5 g/dL — ABNORMAL LOW (ref 12.0–15.0)
MCH: 31.3 pg (ref 26.0–34.0)
MCHC: 32.3 g/dL (ref 30.0–36.0)
MCV: 96.7 fL (ref 78.0–100.0)
Platelets: 497 10*3/uL — ABNORMAL HIGH (ref 150–400)
RBC: 3.04 MIL/uL — ABNORMAL LOW (ref 3.87–5.11)
RDW: 14.9 % (ref 11.5–15.5)
WBC: 10 10*3/uL (ref 4.0–10.5)

## 2010-07-12 LAB — PROTIME-INR
INR: 1.07 (ref 0.00–1.49)
Prothrombin Time: 14.1 seconds (ref 11.6–15.2)

## 2010-07-12 MED ORDER — IOHEXOL 300 MG/ML  SOLN
100.0000 mL | Freq: Once | INTRAMUSCULAR | Status: AC | PRN
  Administered 2010-07-12: 100 mL via INTRAVENOUS

## 2010-07-13 LAB — BASIC METABOLIC PANEL
BUN: 5 mg/dL — ABNORMAL LOW (ref 6–23)
CO2: 30 mEq/L (ref 19–32)
Calcium: 8.6 mg/dL (ref 8.4–10.5)
Chloride: 99 mEq/L (ref 96–112)
Creatinine, Ser: 0.46 mg/dL (ref 0.4–1.2)
GFR calc Af Amer: >60 mL/min (ref >60)
GFR calc non Af Amer: >60 mL/min (ref >60)
Glucose, Bld: 116 mg/dL — ABNORMAL HIGH (ref 70–99)
Potassium: 3.7 mEq/L (ref 3.5–5.1)
Sodium: 136 mEq/L (ref 135–145)

## 2010-07-13 LAB — PROTIME-INR
INR: 1.36 (ref 0.00–1.49)
Prothrombin Time: 17 seconds — ABNORMAL HIGH (ref 11.6–15.2)

## 2010-07-13 LAB — CBC
HCT: 29.3 % — ABNORMAL LOW (ref 36.0–46.0)
Hemoglobin: 9.6 g/dL — ABNORMAL LOW (ref 12.0–15.0)
MCH: 31.3 pg (ref 26.0–34.0)
MCHC: 32.8 g/dL (ref 30.0–36.0)
MCV: 95.4 fL (ref 78.0–100.0)
Platelets: 450 10*3/uL — ABNORMAL HIGH (ref 150–400)
RBC: 3.07 MIL/uL — ABNORMAL LOW (ref 3.87–5.11)
RDW: 14.6 % (ref 11.5–15.5)
WBC: 7.1 10*3/uL (ref 4.0–10.5)

## 2010-07-14 LAB — PROTIME-INR
INR: 1.86 — ABNORMAL HIGH (ref 0.00–1.49)
Prothrombin Time: 21.6 seconds — ABNORMAL HIGH (ref 11.6–15.2)

## 2010-07-15 LAB — PREGNANCY, URINE: Preg Test, Ur: NEGATIVE

## 2010-07-15 LAB — PROTIME-INR
INR: 2.13 — ABNORMAL HIGH (ref 0.00–1.49)
Prothrombin Time: 24 seconds — ABNORMAL HIGH (ref 11.6–15.2)

## 2010-07-16 LAB — PROTIME-INR
INR: 2.53 — ABNORMAL HIGH (ref 0.00–1.49)
Prothrombin Time: 27.4 seconds — ABNORMAL HIGH (ref 11.6–15.2)

## 2010-07-17 LAB — PROTIME-INR
INR: 2.72 — ABNORMAL HIGH (ref 0.00–1.49)
Prothrombin Time: 28.9 seconds — ABNORMAL HIGH (ref 11.6–15.2)

## 2010-07-18 LAB — PROTIME-INR
INR: 2.86 — ABNORMAL HIGH (ref 0.00–1.49)
Prothrombin Time: 30.1 seconds — ABNORMAL HIGH (ref 11.6–15.2)

## 2010-07-19 LAB — URINE MICROSCOPIC-ADD ON

## 2010-07-19 LAB — URINALYSIS, ROUTINE W REFLEX MICROSCOPIC
Bilirubin Urine: NEGATIVE
Glucose, UA: NEGATIVE mg/dL
Hgb urine dipstick: NEGATIVE
Ketones, ur: NEGATIVE mg/dL
Nitrite: NEGATIVE
Protein, ur: NEGATIVE mg/dL
Specific Gravity, Urine: 1.017 (ref 1.005–1.030)
Urobilinogen, UA: 0.2 mg/dL (ref 0.0–1.0)
pH: 6 (ref 5.0–8.0)

## 2010-07-19 LAB — PROTIME-INR
INR: 3 — ABNORMAL HIGH (ref 0.00–1.49)
Prothrombin Time: 31.2 seconds — ABNORMAL HIGH (ref 11.6–15.2)

## 2010-07-20 LAB — URINE CULTURE
Colony Count: NO GROWTH
Culture  Setup Time: 201204211811
Culture: NO GROWTH

## 2010-07-20 LAB — PROTIME-INR
INR: 4.33 — ABNORMAL HIGH (ref 0.00–1.49)
Prothrombin Time: 41.4 seconds — ABNORMAL HIGH (ref 11.6–15.2)

## 2010-07-21 ENCOUNTER — Encounter (HOSPITAL_COMMUNITY): Payer: Self-pay | Admitting: Radiology

## 2010-07-21 ENCOUNTER — Inpatient Hospital Stay (HOSPITAL_COMMUNITY): Payer: No Typology Code available for payment source

## 2010-07-21 LAB — BASIC METABOLIC PANEL
BUN: <1 mg/dL — ABNORMAL LOW (ref 6–23)
CO2: 27 mEq/L (ref 19–32)
Calcium: 7.6 mg/dL — ABNORMAL LOW (ref 8.4–10.5)
Chloride: 100 mEq/L (ref 96–112)
Creatinine, Ser: 0.36 mg/dL — ABNORMAL LOW (ref 0.4–1.2)
GFR calc Af Amer: >60 mL/min (ref >60)
GFR calc non Af Amer: >60 mL/min (ref >60)
Glucose, Bld: 87 mg/dL (ref 70–99)
Potassium: 3.3 mEq/L — ABNORMAL LOW (ref 3.5–5.1)
Sodium: 134 mEq/L — ABNORMAL LOW (ref 135–145)

## 2010-07-21 LAB — DIFFERENTIAL
Basophils Absolute: 0.1 10*3/uL (ref 0.0–0.1)
Basophils Relative: 1 % (ref 0–1)
Eosinophils Absolute: 0.2 10*3/uL (ref 0.0–0.7)
Eosinophils Relative: 3 % (ref 0–5)
Lymphocytes Relative: 20 % (ref 12–46)
Lymphs Abs: 1.8 10*3/uL (ref 0.7–4.0)
Monocytes Absolute: 1.1 10*3/uL — ABNORMAL HIGH (ref 0.1–1.0)
Monocytes Relative: 12 % (ref 3–12)
Neutro Abs: 6.1 10*3/uL (ref 1.7–7.7)
Neutrophils Relative %: 66 % (ref 43–77)

## 2010-07-21 LAB — CBC
HCT: 12.7 % — ABNORMAL LOW (ref 36.0–46.0)
HCT: 17.7 % — ABNORMAL LOW (ref 36.0–46.0)
Hemoglobin: 4.2 g/dL — CL (ref 12.0–15.0)
Hemoglobin: 6.1 g/dL — CL (ref 12.0–15.0)
MCH: 31.3 pg (ref 26.0–34.0)
MCH: 30.7 pg (ref 26.0–34.0)
MCHC: 33.1 g/dL (ref 30.0–36.0)
MCHC: 34.5 g/dL (ref 30.0–36.0)
MCV: 94.8 fL (ref 78.0–100.0)
MCV: 88.9 fL (ref 78.0–100.0)
Platelets: 213 10*3/uL (ref 150–400)
Platelets: 218 10*3/uL (ref 150–400)
RBC: 1.34 MIL/uL — ABNORMAL LOW (ref 3.87–5.11)
RBC: 1.99 MIL/uL — ABNORMAL LOW (ref 3.87–5.11)
RDW: 14.6 % (ref 11.5–15.5)
RDW: 16.4 % — ABNORMAL HIGH (ref 11.5–15.5)
WBC: 9.3 10*3/uL (ref 4.0–10.5)
WBC: 11.1 10*3/uL — ABNORMAL HIGH (ref 4.0–10.5)

## 2010-07-21 LAB — PROTIME-INR
INR: 2.57 — ABNORMAL HIGH (ref 0.00–1.49)
Prothrombin Time: 27.7 seconds — ABNORMAL HIGH (ref 11.6–15.2)

## 2010-07-21 LAB — HEMOGLOBIN AND HEMATOCRIT, BLOOD
HCT: 12.3 % — ABNORMAL LOW (ref 36.0–46.0)
Hemoglobin: 4 g/dL — CL (ref 12.0–15.0)

## 2010-07-21 MED ORDER — IOHEXOL 300 MG/ML  SOLN
100.0000 mL | Freq: Once | INTRAMUSCULAR | Status: AC | PRN
  Administered 2010-07-21: 100 mL via INTRAVENOUS
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
## 2010-07-22 LAB — CBC
HCT: 25.6 % — ABNORMAL LOW (ref 36.0–46.0)
Hemoglobin: 8.9 g/dL — ABNORMAL LOW (ref 12.0–15.0)
MCH: 30.3 pg (ref 26.0–34.0)
MCHC: 34.8 g/dL (ref 30.0–36.0)
MCV: 87.1 fL (ref 78.0–100.0)
Platelets: 217 10*3/uL (ref 150–400)
RBC: 2.94 MIL/uL — ABNORMAL LOW (ref 3.87–5.11)
RDW: 16 % — ABNORMAL HIGH (ref 11.5–15.5)
WBC: 10.4 10*3/uL (ref 4.0–10.5)

## 2010-07-22 LAB — BASIC METABOLIC PANEL
BUN: 2 mg/dL — ABNORMAL LOW (ref 6–23)
CO2: 28 mEq/L (ref 19–32)
Calcium: 7.9 mg/dL — ABNORMAL LOW (ref 8.4–10.5)
Chloride: 99 mEq/L (ref 96–112)
Creatinine, Ser: 0.33 mg/dL — ABNORMAL LOW (ref 0.4–1.2)
GFR calc Af Amer: >60 mL/min (ref >60)
GFR calc non Af Amer: >60 mL/min (ref >60)
Glucose, Bld: 87 mg/dL (ref 70–99)
Potassium: 3.7 mEq/L (ref 3.5–5.1)
Sodium: 136 mEq/L (ref 135–145)

## 2010-07-22 LAB — PREPARE FRESH FROZEN PLASMA
Unit division: 0
Unit division: 0

## 2010-07-22 LAB — PROTIME-INR
INR: 1.07 (ref 0.00–1.49)
Prothrombin Time: 14.1 seconds (ref 11.6–15.2)

## 2010-07-23 LAB — PROTIME-INR
INR: 0.99 (ref 0.00–1.49)
Prothrombin Time: 13.3 seconds (ref 11.6–15.2)

## 2010-07-24 LAB — BASIC METABOLIC PANEL
BUN: <1 mg/dL — ABNORMAL LOW (ref 6–23)
CO2: 29 mEq/L (ref 19–32)
Calcium: 8.4 mg/dL (ref 8.4–10.5)
Chloride: 102 mEq/L (ref 96–112)
Creatinine, Ser: 0.34 mg/dL — ABNORMAL LOW (ref 0.4–1.2)
GFR calc Af Amer: >60 mL/min (ref >60)
GFR calc non Af Amer: >60 mL/min (ref >60)
Glucose, Bld: 93 mg/dL (ref 70–99)
Potassium: 3.7 mEq/L (ref 3.5–5.1)
Sodium: 140 mEq/L (ref 135–145)

## 2010-07-24 LAB — CBC
HCT: 29.6 % — ABNORMAL LOW (ref 36.0–46.0)
Hemoglobin: 9.7 g/dL — ABNORMAL LOW (ref 12.0–15.0)
MCH: 29.8 pg (ref 26.0–34.0)
MCHC: 32.8 g/dL (ref 30.0–36.0)
MCV: 90.8 fL (ref 78.0–100.0)
Platelets: 255 10*3/uL (ref 150–400)
RBC: 3.26 MIL/uL — ABNORMAL LOW (ref 3.87–5.11)
RDW: 15.7 % — ABNORMAL HIGH (ref 11.5–15.5)
WBC: 5.8 10*3/uL (ref 4.0–10.5)

## 2010-07-24 LAB — PROTIME-INR
INR: 1.01 (ref 0.00–1.49)
Prothrombin Time: 13.5 seconds (ref 11.6–15.2)

## 2010-07-25 LAB — CROSSMATCH
ABO/RH(D): B POS
Antibody Screen: POSITIVE
DAT, IgG: NEGATIVE
Unit division: 0
Unit division: 0
Unit division: 0
Unit division: 0
Unit division: 0
Unit division: 0
Unit division: 0

## 2010-07-25 LAB — PROTIME-INR
INR: 0.93 (ref 0.00–1.49)
Prothrombin Time: 12.7 seconds (ref 11.6–15.2)
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
## 2010-07-28 ENCOUNTER — Inpatient Hospital Stay (HOSPITAL_COMMUNITY): Payer: No Typology Code available for payment source

## 2010-07-30 LAB — CBC
HCT: 36 % (ref 36.0–46.0)
Hemoglobin: 11.7 g/dL — ABNORMAL LOW (ref 12.0–15.0)
MCH: 30 pg (ref 26.0–34.0)
MCHC: 32.5 g/dL (ref 30.0–36.0)
MCV: 92.3 fL (ref 78.0–100.0)
Platelets: 386 10*3/uL (ref 150–400)
RBC: 3.9 MIL/uL (ref 3.87–5.11)
RDW: 14.2 % (ref 11.5–15.5)
WBC: 5.7 10*3/uL (ref 4.0–10.5)
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
## 2010-08-12 NOTE — Consult Note (Signed)
[name], [name]                 ACCOUNT NO.:  [id number]   MEDICAL RECORD NO.:  [id number]          PATIENT TYPE:  OUT   LOCATION:  GYN                          FACILITY:  [facility]   PHYSICIAN:  Paola A. Duard Brady, MD    DATE OF BIRTH:  [birth year]   DATE OF CONSULTATION:  09/14/2006  DATE OF DISCHARGE:                                 CONSULTATION   [name] is a 25 year old who I initially saw in May, 2007.  At that  time, she had high-grade dysplasia but was pregnant.  She subsequently  delivered in May, 2007.  She was supposedly scheduled to have a LEEP  performed by Dr. Arelia Sneddon; however, she did not have her LEEP and  subsequently scheduled to have a LEEP with me in [facility], and she  did not call to cancel that appointment nor come to that appointment.  She has missed several appointments with [name], and we have been attempting  to get her into our system.  We have contacted her mother and have sent  her a certified letter.  We sent her a certified letter on June 5th  stating the urgency of her coming into an appointment, and she came in  today.  She comes in accompanied by her friend, Brooks Sailors, who states  that she will insure that the patient make all of her visits.  Ms.  Harriett Rush phone number is [phone].  In addition, we also have the liberty  of contacting the patient's mother, [name], at [phone].   [name] otherwise has no complaints.  She has two boys with the last  one being born in May, 2007.  She is sexually active.  She does admit to  post-coital bleeding.  She uses condoms for contraception.  Her last  cycle was Aug 25, 2006.  Her friend, [name], states that she is having  intramenstrual bleeding and some irregular cycles, and they would like  [name] to consider birth control pills for regulation.   PHYSICAL EXAMINATION:  VITAL SIGNS:  Weight 129 pounds.  GENERAL:  A well-developed and well-nourished female in no acute  distress.  PELVIC:  External genitalia  is within normal limits.  The vagina is well  epithelialized.  The cervix is multiparous.  There is no physiologic  discharge.  There is no gross visible lesion.  ThinPrep Pap was  submitted without difficulty.  Acetic acid was then applied to the  cervix.  There was an acetyl white epithelial change at the 6 o'clock  position on the cervix with some vascular changes that are notable  without colposcopy.  After obtaining the patient's verbal consent,  cervical biopsy at 6 o'clock was performed.  Hemostasis was obtained  using silver nitrate.  Bimanual examination of the cervix is palpably  normal.  The corpus is normal in size, shape, and consistently.  There  are no adnexal masses.   ASSESSMENT:  A 25 year old poorly compliant patient with high-grade  dysplasia.  We have tried to impress upon her the importance of close  followup.  Her friend, [name], states that she  will insure that the  patient keeps her appointment.  We will contact the patient with the  results of her Pap smear and her biopsy from today and determine her  disposition pending them.  For her irregular bleeding, we will start her  on Yaz birth control pills.  She was given a prescription for the above  today.      Paola A. Duard Brady, MD  Electronically Signed     PAG/MEDQ  D:  09/14/2006  T:  09/14/2006  Job:  [id number]

## 2010-08-13 ENCOUNTER — Encounter (HOSPITAL_BASED_OUTPATIENT_CLINIC_OR_DEPARTMENT_OTHER): Payer: No Typology Code available for payment source | Attending: Plastic Surgery

## 2010-08-13 DIAGNOSIS — S71009A Unspecified open wound, unspecified hip, initial encounter: Secondary | ICD-10-CM | POA: Insufficient documentation

## 2010-08-13 DIAGNOSIS — S71109A Unspecified open wound, unspecified thigh, initial encounter: Secondary | ICD-10-CM | POA: Insufficient documentation
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
## 2010-08-15 NOTE — Consult Note (Signed)
[name], [name]                 ACCOUNT NO.:  [id number]   MEDICAL RECORD NO.:  [id number]          PATIENT TYPE:  OUT   LOCATION:  GYN                          FACILITY:  [facility]   PHYSICIAN:  Paola A. Duard Brady, MD    DATE OF BIRTH:  Mar 12, 1986   DATE OF CONSULTATION:  10/28/2005  DATE OF DISCHARGE:                                   CONSULTATION   The patient is a 25 year old gravida 2, para 2, who I initially saw on Jul 28, 2005.  At that time, after multiple failed appointments, she came in for  follow-up of a Pap smear that showed high-grade dysplasia.  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  When I saw her, she had an  acetowhite epithelial change on the posterior lip of the cervix.  A biopsy  was not performed as this did not appear to be invasive carcinoma.  In  addition, her cervix was 3 cm dilated.  She was 50% face and -1 position.  She subsequent delivered three days later and had a spontaneous vaginal  delivery.  The baby was in either the NICU or high risk nursery for several  weeks secondary to some respiratory issues.  The baby is now at home.  His  name is Joselyn Glassman and is doing well.  She is otherwise doing quite well and  denies any significant complaints.  She is sexually active.  She is using  condoms.  She is not interested in other types of birth control.  She  continues to smoke.   PHYSICAL EXAMINATION:  GENERAL:  Well-nourished, alert female in no acute  distress.  PELVIC:  External genitalia is within normal limits.  The vagina is well  epithelialized.  The cervix is multiparous.  There are no gross visible  lesions.  ThinPrep Pap was submitted without difficulty.  Colposcopic  evaluation performed after the application acetic acid.  There were  acetowhite epithelial changes both on the anterior and posterior lips of the  cervix.  It was much more prominent on the anterior  lip of the cervix.  After obtaining the patient's verbal consent, a cervical biopsy at the 12  o'clock position was performed without difficulty.  She tolerated it well.  Silver nitrate was used for hemostasis.   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  I had a lengthy discussion with the  patient regarding smoking cessation both for treatment of cervical dysplasia  but also for the benefit of her children.  She is interested in having more  children.  She is not interested in using anything other than condoms at  that this point but I discussed with her that if this does reveal high-grade  dysplasia this will need to be treated and my preference would be to treat  this before she conceived again.  If  this reveals low grade dysplasia and  she is  having some regression after her vaginal delivery, we could follow her  conservatively.  We will follow up on the results of her Pap smear and her  biopsy from today.  We will contact her with the results and determine her  disposition pending them.      Paola A. Duard Brady, MD  Electronically Signed     PAG/MEDQ  D:  10/28/2005  T:  10/29/2005  Job:  [id number]   cc:   Telford Nab, R.N.  [address]  [place], [place] [address]   Ruthy Dick  Fax: [phone]   Ernestina Penna  Fax: [phone]
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
## 2010-08-15 NOTE — Consult Note (Signed)
[name], [name]                 ACCOUNT NO.:  [id number]   MEDICAL RECORD NO.:  [id number]          PATIENT TYPE:  OUT   LOCATION:  GYN                          FACILITY:  [facility]   PHYSICIAN:  Paola A. Duard Brady, MD    DATE OF BIRTH:  23-Aug-1985   DATE OF CONSULTATION:  DATE OF DISCHARGE:                                   CONSULTATION   REFERRING PHYSICIAN:  Dr. Mora Appl   CONSULTING PHYSICIAN:  Dr. Ernestina Penna   HISTORY OF PRESENT ILLNESS:  The patient is seen today in consultation at  the request of Dr. Mora Appl and Dr. Ralph Dowdy.  [name] is an 25 year old  gravida 1, para 1 who is currently at 28 weeks of gestation with an  estimated due date by LMP and 17-week ultrasound of September 02, 2005.  She had  a Pap smear early in this pregnancy in November 2006 that revealed a couple  of squamous cells cannot exclude high-grade dysplasia and Trich was present.  They attempted multiple appointments for colposcopy which she did not come  to including December 7 as well as January 12.  She was sent a certified  letter and ultimately returned to clinic for reinstitution of prenatal care  of 32 weeks.  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  She comes in today for evaluation of the above.  Since that time  she has also managed to keep another prenatal visit and has an appointment  tomorrow with Dr. Ralph Dowdy.  She is otherwise doing quite well.  She states she  has good fetal movement.  She denies any contractions but does complain of  some pelvic pain and pressure.  She did have some bleeding prior to her  colposcopy in April but since that time it has stopped.  She is currently  not sexually active however when she was sexually active she denies any  postcoital bleeding.   PAST MEDICAL HISTORY:  None.   PAST SURGICAL HISTORY:  One normal spontaneous vaginal delivery.   ALLERGIES:  FLAGYL WHICH  CAUSES HIVES, EXCEDRIN WHICH CAUSES ANAPHYLAXIS.   SOCIAL HISTORY:  She smokes four cigarettes per day.  She denies use of  alcohol or drugs.  She is single.  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   FAMILY HISTORY:  Family history significant for maternal grandmother with  bladder cancer and breast cancer; she was a smoker.  Paternal grandmother  with diabetes.  Mother with hypertension.  She has several siblings (she is  the youngest) who are alive and well.   PHYSICAL EXAMINATION:  VITALS:  Blood pressure 106/64, pulse 82,  respirations 18.  GENERAL:  Well-nourished, well-developed, gravid female in no acute  distress.  PELVIC:  External genitalia is within normal limits.  The vagina is  epithelialized.  The cervix is visualized, it is multiparous.  Evaluation  was performed.  She has a prominent ectropion on the  inferior lip of the  cervix.  There are no gross visible lesions.  There is no obvious evidence  of carcinoma.  Acetic acid was applied.  There is acetowhite epithelial  change on the posterior lip of the cervix.  Biopsy was not performed.  Bimanual examination:  Cervix is 3, 50, -1, vertex position.   ASSESSMENT AND PLAN:  1.  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  It is unclear from      the pathology report where the biopsy was obtained; however, based on      her examination today, I feel that it is most likely from the inferior      lip.  Based on her late gestational age being at 40 weeks with a dilated      __________ efface cervix, we will not proceed with biopsies today.      Discussed with her that she needs very close followup in the postpartum      period to treat her preinvasive carcinoma.  She was given our card and      instructions to call us when she delivers and we will schedule her to      see [place] 6 weeks postpartum for follow-up colposcopy and directed      biopsies.  The importance of  this was explained to her.  She states that      she understands and will keep her appointments.  2.  She has an appointment with Dr. Ralph Dowdy tomorrow.  She does not know if      she has had significant contractions or not, she is unable to ascertain      this.  She was told today however that she is slightly preterm and she      should take it easy and drink significant fluids today.  If she begins      having more contraction pains or pressure or rupture of membranes she is      to call her obstetrician and she will follow up with him tomorrow.  Her      questions were elicited to her satisfaction and answered.      Paola A. Duard Brady, MD  Electronically Signed     PAG/MEDQ  D:  07/28/2005  T:  07/28/2005  Job:  [id number]   cc:   Ernestina Penna  Fax: [phone]   Ruthy Dick  Fax: [phone]   Telford Nab, R.N.  [address]  [place], [place] [address]
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
## 2010-09-02 ENCOUNTER — Encounter (HOSPITAL_BASED_OUTPATIENT_CLINIC_OR_DEPARTMENT_OTHER): Payer: Self-pay | Attending: Plastic Surgery

## 2010-09-02 DIAGNOSIS — S71109A Unspecified open wound, unspecified thigh, initial encounter: Secondary | ICD-10-CM | POA: Insufficient documentation

## 2010-09-02 DIAGNOSIS — S71009A Unspecified open wound, unspecified hip, initial encounter: Secondary | ICD-10-CM | POA: Insufficient documentation

## 2010-09-18 NOTE — Assessment & Plan Note (Unsigned)
[facility]  [name], [name]                 ACCOUNT NO.:  [id number]  MEDICAL RECORD NO.:  [id number]      DATE OF BIRTH:  1985/05/11  PHYSICIAN:  Wayland Denis, DO       VISIT DATE:  09/17/2010                                  OFFICE VISIT   [name] is a 25 year old white female who is here for followup on her right hip wound.  She has been doing wet-to-dry and has been actually doing extremely well.  It looks clean and granulating.  There is no sign of infection.  No change in her medications or social history.  On exam, she is alert, oriented, and cooperative, in no acute distress.  She is very pleasant.  She is more pleasant today than she has been.  Her lungs are clear.  Heart is regular.  Abdomen is soft.  Wound is granulating. No sign of infection.  Plan for continue wet-to-dry, OR in a few days for irrigation and any debridement of fibrous tissue with placement of ACell and VAC, and we will see her back.     Wayland Denis, DO     CS/MEDQ  D:  09/17/2010  T:  09/18/2010  Job:  [id number]

## 2010-09-22 ENCOUNTER — Ambulatory Visit (HOSPITAL_BASED_OUTPATIENT_CLINIC_OR_DEPARTMENT_OTHER)
Admission: RE | Admit: 2010-09-22 | Discharge: 2010-09-22 | Disposition: A | Discharge status: Home / Self Care | Payer: Self-pay | Source: Ambulatory Visit | Attending: Plastic Surgery | Admitting: Plastic Surgery

## 2010-09-22 DIAGNOSIS — Z01812 Encounter for preprocedural laboratory examination: Secondary | ICD-10-CM | POA: Insufficient documentation

## 2010-09-22 DIAGNOSIS — F17200 Nicotine dependence, unspecified, uncomplicated: Secondary | ICD-10-CM | POA: Insufficient documentation

## 2010-09-22 DIAGNOSIS — L97109 Non-pressure chronic ulcer of unspecified thigh with unspecified severity: Secondary | ICD-10-CM | POA: Insufficient documentation

## 2010-09-22 DIAGNOSIS — J45909 Unspecified asthma, uncomplicated: Secondary | ICD-10-CM | POA: Insufficient documentation

## 2010-09-22 DIAGNOSIS — F319 Bipolar disorder, unspecified: Secondary | ICD-10-CM | POA: Insufficient documentation

## 2010-09-22 LAB — POCT HEMOGLOBIN-HEMACUE: Hemoglobin: 13.7 g/dL (ref 12.0–15.0)

## 2010-09-29 ENCOUNTER — Encounter (HOSPITAL_BASED_OUTPATIENT_CLINIC_OR_DEPARTMENT_OTHER): Payer: Self-pay | Attending: Internal Medicine

## 2010-09-29 DIAGNOSIS — S71109A Unspecified open wound, unspecified thigh, initial encounter: Secondary | ICD-10-CM | POA: Insufficient documentation

## 2010-09-29 DIAGNOSIS — S71009A Unspecified open wound, unspecified hip, initial encounter: Secondary | ICD-10-CM | POA: Insufficient documentation
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
## 2010-10-27 ENCOUNTER — Ambulatory Visit (HOSPITAL_BASED_OUTPATIENT_CLINIC_OR_DEPARTMENT_OTHER)
Admission: RE | Admit: 2010-10-27 | Discharge: 2010-10-27 | Disposition: A | Discharge status: Home / Self Care | Payer: Self-pay | Source: Ambulatory Visit | Attending: Plastic Surgery | Admitting: Plastic Surgery

## 2010-10-27 DIAGNOSIS — T8189XA Other complications of procedures, not elsewhere classified, initial encounter: Secondary | ICD-10-CM | POA: Insufficient documentation

## 2010-10-27 DIAGNOSIS — Z01812 Encounter for preprocedural laboratory examination: Secondary | ICD-10-CM | POA: Insufficient documentation

## 2010-10-27 DIAGNOSIS — Y849 Medical procedure, unspecified as the cause of abnormal reaction of the patient, or of later complication, without mention of misadventure at the time of the procedure: Secondary | ICD-10-CM | POA: Insufficient documentation

## 2010-10-27 LAB — POCT HEMOGLOBIN-HEMACUE: Hemoglobin: 15.8 g/dL — ABNORMAL HIGH (ref 12.0–15.0)

## 2010-10-29 ENCOUNTER — Encounter (HOSPITAL_BASED_OUTPATIENT_CLINIC_OR_DEPARTMENT_OTHER): Payer: Self-pay | Attending: Internal Medicine

## 2010-10-29 DIAGNOSIS — S71109A Unspecified open wound, unspecified thigh, initial encounter: Secondary | ICD-10-CM | POA: Insufficient documentation

## 2010-10-29 DIAGNOSIS — S71009A Unspecified open wound, unspecified hip, initial encounter: Secondary | ICD-10-CM | POA: Insufficient documentation
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
## 2010-11-06 NOTE — Progress Notes (Signed)
[facility]  NAME:  [name], [name]                      ACCOUNT NO.:  MEDICAL RECORD NO.:  [id number]      DATE OF BIRTH:  10/28/1985  PHYSICIAN:  Wayland Denis, DO       VISIT DATE:  11/05/2010                                  OFFICE VISIT   [name] is here for followup on her right hip wound.  She underwent ACell placement in the OR and is doing very well with a considerable decrease in the size and depth of her wound and undermining.  It is granulating well.  There is no sign of infection.  There has been no change in her medications and everything else is stable.  Her social situation has improved a little bit.  Her friends are with her today. She is a little bit down, but otherwise stable.  PHYSICAL EXAMINATION:  GENERAL:  She is alert and oriented, cooperative, in no acute distress.  She is pleasant. HEENT:  Her pupils are equal and reactive.  Extraocular muscles are intact. LUNGS:  Her breathing is normal. ABDOMEN:  Soft. EXTREMITIES:  The wound is as described.  We went ahead and put more ACell on, reference [id number], lot [id number], expiration 2013-11 and reference MM0200, lot [id number], expiration 2014- 04.  She is tolerating that extremely well.  PLAN:  Dressing with hydrogel and Adaptic.  Follow up in 5 days and 1 week.     Wayland Denis, DO     CS/MEDQ  D:  11/05/2010  T:  11/06/2010  Job:  [id number]

## 2010-11-20 NOTE — Progress Notes (Signed)
[facility]  NAMECHANCIE, LAMPERT                 ACCOUNT NO.:  [id number]  MEDICAL RECORD NO.:  [id number]      DATE OF BIRTH:  Aug 05, 1985  PHYSICIAN:  Wayland Denis, DO       VISIT DATE:  11/19/2010                                  OFFICE VISIT   [name] is a 25 year old female who is here for followup on her right hip ulcer.  She has been using normal saline and dressing changes for the last several days because the ACell came out.  She has had a really good improvement in her overall healing process.  The depth is improved as well as the size.  She just has a little bit of undermining that I can get that repaired.  We can go ahead and skin graft her.  GENERAL:  She was alert, oriented, cooperative, no acute distress.  She is pleasant for the exam. SKIN:  Wound is as described.  Good granulation tissue.  No sign of infection.  We will go ahead and place the collagen today.  She may also be a candidate for [facility] if we can get that approved.  We will see her back in 1 week.     Wayland Denis, DO     CS/MEDQ  D:  11/19/2010  T:  11/20/2010  Job:  [id number]

## 2010-11-26 NOTE — Progress Notes (Signed)
[facility]  NAMEBICH, MCHANEY                 ACCOUNT NO.:  [id number]  MEDICAL RECORD NO.:  [id number]      DATE OF BIRTH:  12/25/1985  PHYSICIAN:  Wayland Denis, DO       VISIT DATE:  11/26/2010                                  OFFICE VISIT   The patient comes here with her boy friend for followup on the right hip ulcer.  She is doing really well and the wound has decreased significantly in size.  It was 3.5 x 5 and is now 3.2 x 4.4.  It is clean.  There is no sign of infection, little 1 cm sinus tract at the 5 o'clock position.  No other undermining.  There has been no change in her medicines.  On exam, she is alert and oriented, cooperative, in no acute distress.  Her breathing is unlabored.  The wound is as described. She has got good capillary refill around it, some movement of tissue. We talked about the possibility of a flap, however, unfortunately, she is smoking.  So, we will continue with collagen and have her follow up in 2 weeks.     Wayland Denis, DO     CS/MEDQ  D:  11/26/2010  T:  11/26/2010  Job:  [phone]

## 2010-12-10 ENCOUNTER — Encounter (HOSPITAL_BASED_OUTPATIENT_CLINIC_OR_DEPARTMENT_OTHER): Payer: Self-pay | Attending: Plastic Surgery

## 2010-12-10 DIAGNOSIS — L98499 Non-pressure chronic ulcer of skin of other sites with unspecified severity: Secondary | ICD-10-CM | POA: Insufficient documentation

## 2010-12-11 NOTE — Progress Notes (Signed)
[facility]  NAME:  BENTLI, LLORENTE                      ACCOUNT NO.:  MEDICAL RECORD NO.:  [id number]      DATE OF BIRTH:  20-Feb-1986  PHYSICIAN:  Wayland Denis, DO       VISIT DATE:  12/10/2010                                  OFFICE VISIT   [name] is here with her boyfriend for followup on her right hip ulcer. She has been using collagen for the past 2 weeks and healing extremely well, filling in and epithelializing at the edges.  There has been no other major change in her review of systems, medications, her other injury are stabilized.  On exam, she is alert, oriented, cooperative, in no acute distress.  She is very pleasant.  She seems to be maintaining weight.  Her pupils are equal.  Extraocular muscles are intact.  No cervical lymphadenopathy.  Breathing is unlabored.  Heart is regular. Abdomen is soft.  Wound is slightly tender.  It is probably nerve regrowth.  The rest of the wound is as described.  Plan to continue with the collagen, see if we can get her one of the disposable VAC and set her up for skin graft.     Wayland Denis, DO     CS/MEDQ  D:  12/10/2010  T:  12/10/2010  Job:  [id number]

## 2010-12-24 NOTE — Progress Notes (Signed)
[facility]  Kaitlyn, [name]                 ACCOUNT NO.:  [id number]  MEDICAL RECORD NO.:  [id number]      DATE OF BIRTH:  [birth year]  PHYSICIAN:  Wayland Denis, DO       VISIT DATE:  12/24/2010                                  OFFICE VISIT   [name] is here with her boyfriend for followup on her right hip ulcer.  She is doing very well.  The wound is actually quite a bit smaller and filling in.  The surrounding tissue is much more supple, lot less swelling not as nearly as hard as it has been.  There is no change in her medications or social history.  PHYSICAL EXAMINATION:  GENERAL:  She is alert, oriented, cooperative, not in any acute distress. HEENT:  Pupils are equal.  Extraocular muscles are intact. LUNGS:  No breathing difficulty. HEART:  Regular rate and rhythm. ABDOMEN:  Soft.  She has lost little bit of weight.  Wound is as described.  Plan to continue with collagen hydrogel foam pad, still working her up for surgery, and we will see her back in 2 weeks.  I also want to check a prealbumin on her.  I do not like weight loss.     Wayland Denis, DO     CS/MEDQ  D:  12/24/2010  T:  12/24/2010  Job:  [id number]

## 2011-01-07 ENCOUNTER — Emergency Department (HOSPITAL_COMMUNITY)
Admission: EM | Admit: 2011-01-07 | Discharge: 2011-01-07 | Disposition: A | Discharge status: Home / Self Care | Payer: No Typology Code available for payment source | Attending: Emergency Medicine | Admitting: Emergency Medicine

## 2011-01-07 ENCOUNTER — Encounter (HOSPITAL_BASED_OUTPATIENT_CLINIC_OR_DEPARTMENT_OTHER): Payer: Self-pay | Attending: Plastic Surgery

## 2011-01-07 ENCOUNTER — Emergency Department (HOSPITAL_COMMUNITY): Payer: No Typology Code available for payment source

## 2011-01-07 DIAGNOSIS — M549 Dorsalgia, unspecified: Secondary | ICD-10-CM | POA: Insufficient documentation

## 2011-01-07 DIAGNOSIS — M545 Low back pain, unspecified: Secondary | ICD-10-CM | POA: Insufficient documentation

## 2011-01-07 DIAGNOSIS — T1490XA Injury, unspecified, initial encounter: Secondary | ICD-10-CM | POA: Insufficient documentation

## 2011-01-07 DIAGNOSIS — W010XXA Fall on same level from slipping, tripping and stumbling without subsequent striking against object, initial encounter: Secondary | ICD-10-CM | POA: Insufficient documentation

## 2011-01-07 DIAGNOSIS — L98499 Non-pressure chronic ulcer of skin of other sites with unspecified severity: Secondary | ICD-10-CM | POA: Insufficient documentation

## 2011-01-07 DIAGNOSIS — N949 Unspecified condition associated with female genital organs and menstrual cycle: Secondary | ICD-10-CM | POA: Insufficient documentation

## 2011-01-07 LAB — URINALYSIS, ROUTINE W REFLEX MICROSCOPIC
Bilirubin Urine: NEGATIVE
Glucose, UA: NEGATIVE mg/dL
Hgb urine dipstick: NEGATIVE
Ketones, ur: NEGATIVE mg/dL
Leukocytes, UA: NEGATIVE
Nitrite: POSITIVE — AB
Protein, ur: NEGATIVE mg/dL
Specific Gravity, Urine: 1.021 (ref 1.005–1.030)
Urobilinogen, UA: 0.2 mg/dL (ref 0.0–1.0)
pH: 5.5 (ref 5.0–8.0)

## 2011-01-07 LAB — URINE MICROSCOPIC-ADD ON
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
## 2011-02-04 ENCOUNTER — Encounter (HOSPITAL_BASED_OUTPATIENT_CLINIC_OR_DEPARTMENT_OTHER): Payer: Self-pay | Attending: Plastic Surgery

## 2011-02-04 DIAGNOSIS — T84498A Other mechanical complication of other internal orthopedic devices, implants and grafts, initial encounter: Secondary | ICD-10-CM | POA: Insufficient documentation

## 2011-02-04 DIAGNOSIS — L98499 Non-pressure chronic ulcer of skin of other sites with unspecified severity: Secondary | ICD-10-CM | POA: Insufficient documentation

## 2011-02-04 DIAGNOSIS — K219 Gastro-esophageal reflux disease without esophagitis: Secondary | ICD-10-CM | POA: Insufficient documentation

## 2011-02-04 DIAGNOSIS — Y831 Surgical operation with implant of artificial internal device as the cause of abnormal reaction of the patient, or of later complication, without mention of misadventure at the time of the procedure: Secondary | ICD-10-CM | POA: Insufficient documentation

## 2011-02-04 DIAGNOSIS — J45909 Unspecified asthma, uncomplicated: Secondary | ICD-10-CM | POA: Insufficient documentation

## 2011-02-04 DIAGNOSIS — Z79899 Other long term (current) drug therapy: Secondary | ICD-10-CM | POA: Insufficient documentation

## 2011-02-11 NOTE — Progress Notes (Signed)
[facility]  NAMEYITZEL, SHASTEEN                 ACCOUNT NO.:  [id number]  MEDICAL RECORD NO.:  [id number]      DATE OF BIRTH:  [birth year]  PHYSICIAN:  Wayland Denis, DO       VISIT DATE:  02/11/2011                                  OFFICE VISIT   [name] is a 25 year old white female who is here with her boyfriend for followup on her right hip wound.  She has actually done extremely well. She has been using collagen with a pad and it has completely healed and epithelialized.  Unfortunately, she has had some pelvic pain and workup revealed a loose screw in her pelvic repair, and this is definitely concerning and is something that she is having managed by Dr. Carola Frost. She understands that this may be something that she has to go back to the OR for.  If that happen, she knows to let me know.  There has been no change in her medications or social history otherwise.  PHYSICAL EXAMINATION:  GENERAL:  She is alert, oriented, cooperative, not in any acute distress, very pleasant.  This is the best she has actually looks in months. HEENT:  Her pupils are equal.  Extraocular muscles are intact. NECK:  No cervical lymphadenopathy. LUNGS:  Breathing is unlabored. HEART:  Regular. ABDOMEN:  Soft.  The wound does not look to be infected at all and is doing extremely well, and we will have her follow up in my office in 3-6 months.     Wayland Denis, DO     CS/MEDQ  D:  02/11/2011  T:  02/11/2011  Job:  [id number]

## 2011-03-04 ENCOUNTER — Encounter (HOSPITAL_BASED_OUTPATIENT_CLINIC_OR_DEPARTMENT_OTHER): Payer: Self-pay

## 2011-06-02 ENCOUNTER — Emergency Department (HOSPITAL_COMMUNITY)
Admission: EM | Admit: 2011-06-02 | Discharge: 2011-06-02 | Disposition: A | Discharge status: Home Health | Payer: Self-pay | Attending: Emergency Medicine | Admitting: Emergency Medicine

## 2011-06-02 ENCOUNTER — Encounter (HOSPITAL_COMMUNITY): Payer: Self-pay | Admitting: *Deleted

## 2011-06-02 DIAGNOSIS — IMO0001 Reserved for inherently not codable concepts without codable children: Secondary | ICD-10-CM | POA: Insufficient documentation

## 2011-06-02 DIAGNOSIS — M7918 Myalgia, other site: Secondary | ICD-10-CM

## 2011-06-02 DIAGNOSIS — Z79899 Other long term (current) drug therapy: Secondary | ICD-10-CM | POA: Insufficient documentation

## 2011-06-02 DIAGNOSIS — M25559 Pain in unspecified hip: Secondary | ICD-10-CM | POA: Insufficient documentation

## 2011-06-02 DIAGNOSIS — G8929 Other chronic pain: Secondary | ICD-10-CM | POA: Insufficient documentation

## 2011-06-02 DIAGNOSIS — F17200 Nicotine dependence, unspecified, uncomplicated: Secondary | ICD-10-CM | POA: Insufficient documentation

## 2011-06-02 DIAGNOSIS — H6990 Unspecified Eustachian tube disorder, unspecified ear: Secondary | ICD-10-CM | POA: Insufficient documentation

## 2011-06-02 DIAGNOSIS — X58XXXA Exposure to other specified factors, initial encounter: Secondary | ICD-10-CM | POA: Insufficient documentation

## 2011-06-02 DIAGNOSIS — Z760 Encounter for issue of repeat prescription: Secondary | ICD-10-CM | POA: Insufficient documentation

## 2011-06-02 DIAGNOSIS — S31809A Unspecified open wound of unspecified buttock, initial encounter: Secondary | ICD-10-CM | POA: Insufficient documentation

## 2011-06-02 HISTORY — DX: Unspecified open wound of unspecified buttock, initial encounter: S31.809A

## 2011-06-02 HISTORY — DX: Unspecified fracture of shaft of humerus, right arm, initial encounter for closed fracture: S42.301A

## 2011-06-02 HISTORY — DX: Myalgia, other site: M79.18

## 2011-06-02 HISTORY — DX: Reserved for inherently not codable concepts without codable children: IMO0001

## 2011-06-02 HISTORY — DX: Fracture of unspecified parts of lumbosacral spine and pelvis, initial encounter for closed fracture: S32.9XXA

## 2011-06-02 HISTORY — DX: Encounter for other specified aftercare: Z51.89

## 2011-06-02 MED ORDER — OXYCODONE-ACETAMINOPHEN 5-325 MG PO TABS
2.0000 | ORAL_TABLET | Freq: Once | ORAL | Status: AC
  Administered 2011-06-02: 2 via ORAL
  Filled 2011-06-02: qty 2

## 2011-06-02 MED ORDER — OXYCODONE-ACETAMINOPHEN 5-325 MG PO TABS: ORAL_TABLET | ORAL | Status: AC
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
## 2011-06-02 NOTE — Discharge Instructions (Signed)
RESOURCE GUIDE  Dental Problems  Patients with Medicaid: [facility]                     [facility] [address]                                           [address] Phone:  [phone]                                                  Phone:  [phone]  If unable to pay or uninsured, contact:  [facility] or [facility] to become qualified for the adult dental clinic.  Chronic Pain Problems Contact [facility]  [phone] Patients need to be referred by their primary care doctor.  Insufficient Money for Medicine Contact [employer]:  call "211" or [facility] 271-5999.  No Primary Care Doctor Call [facility]  832-8000 Other agencies that provide inexpensive medical care    [facility]  [phone]    [facility]  [phone]    [facility]  [phone]    Women[facility] Clinic  [phone]    [facility]  [phone]    [facility]  [phone]  Psychological Services [facility]  [phone] [facility]  [phone] [facility]   [phone] (emergency services 641-4993)  Substance Abuse Resources [facility]  336-882-2125 [facility] 336-784-9470 [facility] 336-285-9073 [facility] 336-845-3988 Residential & Outpatient Substance Abuse Program  800-659-3381  Abuse/Neglect [place] Child Abuse Hotline (336) 641-3795 [place] Child Abuse Hotline 800-378-5315 (After Hours)  Emergency Shelter [facility] (336) 271-5985  M[facility] (336) 275-9566 [facility] (704) 372-4663  MRSA Hotline #:   832-7006    [place] [facility]  [facility] [place]     [employer]                          [facility] [address] [place]                       [address]      [address]  [place]                                                [place]                            [place] Phone:  [phone]                                   Phone:  [phone]                 Phone:  [phone]  [facility] Phone:  [phone]    [facility] Child Abuse Hotline (506) 283-4724 352-721-2101 (After Hours)    Take the prescription as directed.  Apply moist heat or ice to the area(s) of discomfort, for 15 minutes at a time, several times per day for the next few days.  Do not fall asleep on a heating or ice pack.  Call your regular medical doctor today to schedule a follow up appointment this week.  Return to the Emergency Department immediately if worsening.

## 2011-06-14 ENCOUNTER — Encounter (HOSPITAL_COMMUNITY): Payer: Self-pay | Admitting: *Deleted

## 2011-06-14 DIAGNOSIS — M545 Low back pain, unspecified: Secondary | ICD-10-CM | POA: Insufficient documentation

## 2011-06-14 DIAGNOSIS — Z9889 Other specified postprocedural states: Secondary | ICD-10-CM | POA: Insufficient documentation

## 2011-06-14 DIAGNOSIS — F329 Major depressive disorder, single episode, unspecified: Secondary | ICD-10-CM | POA: Insufficient documentation

## 2011-06-14 DIAGNOSIS — G8929 Other chronic pain: Secondary | ICD-10-CM | POA: Insufficient documentation

## 2011-06-14 DIAGNOSIS — N949 Unspecified condition associated with female genital organs and menstrual cycle: Secondary | ICD-10-CM | POA: Insufficient documentation

## 2011-06-14 DIAGNOSIS — Z8781 Personal history of (healed) traumatic fracture: Secondary | ICD-10-CM | POA: Insufficient documentation

## 2011-06-14 DIAGNOSIS — F3289 Other specified depressive episodes: Secondary | ICD-10-CM | POA: Insufficient documentation

## 2011-06-14 DIAGNOSIS — M25559 Pain in unspecified hip: Secondary | ICD-10-CM | POA: Insufficient documentation

## 2011-06-14 NOTE — ED Notes (Signed)
Pt c/o hip, pelvic, and lower back pain.

## 2011-06-15 ENCOUNTER — Emergency Department (HOSPITAL_COMMUNITY)
Admission: EM | Admit: 2011-06-15 | Discharge: 2011-06-15 | Disposition: A | Discharge status: Home / Self Care | Payer: Self-pay | Attending: Emergency Medicine | Admitting: Emergency Medicine

## 2011-06-15 DIAGNOSIS — G8929 Other chronic pain: Secondary | ICD-10-CM

## 2011-06-15 MED ORDER — ONDANSETRON HCL 4 MG PO TABS
4.0000 mg | ORAL_TABLET | Freq: Once | ORAL | Status: AC
  Administered 2011-06-15: 4 mg via ORAL
  Filled 2011-06-15: qty 1

## 2011-06-15 MED ORDER — OXYCODONE-ACETAMINOPHEN 5-325 MG PO TABS: 1.0000 | ORAL_TABLET | Freq: Four times a day (QID) | ORAL | Status: AC | PRN

## 2011-06-15 MED ORDER — OXYCODONE-ACETAMINOPHEN 5-325 MG PO TABS
1.0000 | ORAL_TABLET | Freq: Once | ORAL | Status: AC
  Administered 2011-06-15: 1 via ORAL
  Filled 2011-06-15: qty 1

## 2011-06-15 NOTE — Discharge Instructions (Signed)
You have been given a prescription for Percocet one every 6 hours. It is important that you contact your physician, or a chronic pain physician for continued assistance with your back, hip, and pelvis pain. Percocet may cause drowsiness, please use with caution.`Chronic Pain Management Managing chronic pain is not easy. The goal is to provide as much pain relief as possible. There are emotional as well as physical problems. Chronic pain may lead to symptoms of depression which magnify those of the pain. Problems may include:  Anxiety.   Sleep disturbances.   Confused thinking.   Feeling cranky.   Fatigue.   Weight gain or loss.  Identify the source of the pain first, if possible. The pain may be masking another problem. Try to find a pain management specialist or clinic. Work with a team to create a treatment plan for you. MEDICATIONS  May include narcotics or opioids. Larger than normal doses may be needed to control your pain.   Drugs for depression may help.   Over-the-counter medicines may help for some conditions. These drugs may be used along with others for better pain relief.   May be injected into sites such as the spine and joints. Injections may have to be repeated if they wear off.  THERAPY MAY INCLUDE:  Working with a physical therapist to keep from getting stiff.   Regular, gentle exercise.   Cognitive or behavioral therapy.   Using complementary or integrative medicine such as:   Acupuncture.   Massage, Reiki, or Rolfing.   Aroma, color, light, or sound therapy.   Group support.  FOR MORE INFORMATION [email or web address]. American Chronic Pain Association [email or web address]. Document Released: 04/23/2004 Document Revised: 03/05/2011 Document Reviewed: 06/02/2007 [facility] Patient Information 2012 [place], [place].
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
## 2011-06-15 NOTE — ED Provider Notes (Signed)
Medical screening examination/treatment/procedure(s) were performed by non-physician practitioner and as supervising physician I was immediately available for consultation/collaboration.  Kaily Wragg S. Rozell Theiler, MD 06/15/11 0515 

## 2011-07-13 ENCOUNTER — Encounter (HOSPITAL_COMMUNITY): Payer: Self-pay | Admitting: *Deleted

## 2011-07-13 ENCOUNTER — Ambulatory Visit (HOSPITAL_COMMUNITY)
Admission: RE | Admit: 2011-07-13 | Discharge: 2011-07-13 | Disposition: A | Discharge status: Home / Self Care | Payer: Self-pay | Source: Ambulatory Visit | Attending: Orthopedic Surgery | Admitting: Orthopedic Surgery

## 2011-07-13 ENCOUNTER — Emergency Department (HOSPITAL_COMMUNITY)
Admission: EM | Admit: 2011-07-13 | Discharge: 2011-07-13 | Disposition: A | Discharge status: Home / Self Care | Payer: Self-pay | Attending: Emergency Medicine | Admitting: Emergency Medicine

## 2011-07-13 ENCOUNTER — Encounter (HOSPITAL_COMMUNITY): Payer: Self-pay

## 2011-07-13 ENCOUNTER — Other Ambulatory Visit (HOSPITAL_COMMUNITY): Payer: Self-pay | Admitting: Orthopedic Surgery

## 2011-07-13 DIAGNOSIS — M549 Dorsalgia, unspecified: Secondary | ICD-10-CM | POA: Insufficient documentation

## 2011-07-13 DIAGNOSIS — G8929 Other chronic pain: Secondary | ICD-10-CM

## 2011-07-13 DIAGNOSIS — Z8541 Personal history of malignant neoplasm of cervix uteri: Secondary | ICD-10-CM | POA: Insufficient documentation

## 2011-07-13 DIAGNOSIS — Z760 Encounter for issue of repeat prescription: Secondary | ICD-10-CM

## 2011-07-13 DIAGNOSIS — R52 Pain, unspecified: Secondary | ICD-10-CM

## 2011-07-13 DIAGNOSIS — M25559 Pain in unspecified hip: Secondary | ICD-10-CM | POA: Insufficient documentation

## 2011-07-13 DIAGNOSIS — IMO0002 Reserved for concepts with insufficient information to code with codable children: Secondary | ICD-10-CM | POA: Insufficient documentation

## 2011-07-13 DIAGNOSIS — F17200 Nicotine dependence, unspecified, uncomplicated: Secondary | ICD-10-CM | POA: Insufficient documentation

## 2011-07-13 HISTORY — DX: Malignant neoplasm of cervix uteri, unspecified: C53.9

## 2011-07-13 MED ORDER — OXYCODONE-ACETAMINOPHEN 5-325 MG PO TABS: 1.0000 | ORAL_TABLET | ORAL | Status: AC | PRN

## 2011-07-13 MED ORDER — OXYCODONE-ACETAMINOPHEN 5-325 MG PO TABS
1.0000 | ORAL_TABLET | Freq: Once | ORAL | Status: AC
  Administered 2011-07-13: 1 via ORAL
  Filled 2011-07-13: qty 1

## 2011-07-13 NOTE — Discharge Instructions (Signed)
Chronic Pain  Chronic pain can be defined as pain that is lasting, off and on, and lasts for 3 to 6 months or longer. Many things cause chronic pain, which can make it difficult to make a discrete diagnosis. There are many treatment options available for chronic pain. However, finding a treatment that works well for you may require trying various approaches until a suitable one is found.  CAUSES   In some types of chronic medical conditions, the pain is caused by a normal pain response within the body. A normal pain response helps the body identify illness or injury and prevent further damage from being done. In these cases, the cause of the pain may be identified and treated, even if it may not be cured completely. Examples of chronic conditions which can cause chronic pain include:  · Inflammation of the joints (arthritis).  · Back pain or neck pain (including bulging or herniated disks).  · Migraine headaches.  · Cancer.  In some other types of chronic pain syndromes, the pain is caused by an abnormal pain response within the body. An abnormal pain response is present when there is no ongoing cause (or stimulus) for the pain, or when the cause of the pain is arising from the nerves or nervous system itself. Examples of conditions which can cause chronic pain due to an abnormal pain response include:  · Fibromyalgia.  · Reflex sympathetic dystrophy (RSD).  · Neuropathy (when the nerves themselves are damaged, and may cause pain).  DIAGNOSIS   Your caregiver will help diagnose your condition over time. In many cases, the initial focus will be on excluding conditions that could be causing the pain. Depending on your symptoms, your caregiver may order some tests to diagnose your condition. Some of these tests include:  · Blood tests.  · Computerized X-ray scans (CT scan).  · Computerized magnetic scans (MRI).  · X-rays.  · Ultrasounds.  · Nerve conduction studies.  · Consultation with other physicians or  specialists.  TREATMENT   There are many treatment options for people suffering from chronic pain. Finding a treatment that works well may take time.   · You may be referred to a pain management specialist.  · You may be put on medication to help with the pain. Unfortunately, some medications (such as opiate medications) may not be very effective in cases where chronic pain is due to abnormal pain responses. Finding the right medications can take some time.  · Adjunctive therapies may be used to provide additional relief and improve a patient's quality of life. These therapies include:  · Mindfulness meditation.  · Acupuncture.  · Biofeedback.  · Cognitive-behavioral therapy.  · In certain cases, surgical interventions may be attempted.  HOME CARE INSTRUCTIONS   · Make sure you understand these instructions prior to discharge.  · Ask any questions and share any further concerns you have with your caregiver prior to discharge.  · Take all medications as directed by your caregiver.  · Keep all follow-up appointments.  SEEK MEDICAL CARE IF:   · Your pain gets worse.  · You develop a new pain that was not present before.  · You cannot tolerate any medications prescribed by your caregiver.  · You develop new symptoms since your last visit with your caregiver.  SEEK IMMEDIATE MEDICAL CARE IF:   · You develop muscular weakness.  · You have decreased sensation or numbness.  · You lose control of bowel or bladder function.  ·   Your pain suddenly gets much worse.  · You have an oral temperature above 102° F (38.9° C), not controlled by medication.  · You develop shaking chills, confusion, chest pain, or shortness of breath.  Document Released: 12/06/2001 Document Revised: 03/05/2011 Document Reviewed: 03/14/2008  ExitCare® Patient Information ©2012 [employer].  Chronic Pain Management  Managing chronic pain is not easy. The goal is to provide as much pain relief as possible. There are emotional as well as physical problems.  Chronic pain may lead to symptoms of depression which magnify those of the pain.  Problems may include:  · Anxiety.  · Sleep disturbances.  · Confused thinking.  · Feeling cranky.  · Fatigue.  · Weight gain or loss.  Identify the source of the pain first, if possible. The pain may be masking another problem. Try to find a pain management specialist or clinic. Work with a team to create a treatment plan for you.  MEDICATIONS  · May include narcotics or opioids. Larger than normal doses may be needed to control your pain.  · Drugs for depression may help.  · Over-the-counter medicines may help for some conditions. These drugs may be used along with others for better pain relief.  · May be injected into sites such as the spine and joints. Injections may have to be repeated if they wear off.  THERAPY MAY INCLUDE:  · Working with a physical therapist to keep from getting stiff.  · Regular, gentle exercise.  · Cognitive or behavioral therapy.  · Using complementary or integrative medicine such as:  · Acupuncture.  · Massage, Reiki, or Rolfing.  · Aroma, color, light, or sound therapy.  · Group support.  FOR MORE INFORMATION  http://www.painfoundation.org.  American Chronic Pain Association http://www.thealpa.org.  Document Released: 04/23/2004 Document Revised: 03/05/2011 Document Reviewed: 06/02/2007  ExitCare® Patient Information ©2012 [employer].

## 2011-07-13 NOTE — ED Notes (Signed)
Pain lt lower back,  Had ct scan of back  Today and was told to come to ER by her doctor.
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
## 2011-07-20 NOTE — ED Provider Notes (Signed)
History/physical exam/procedure(s) were performed by non-physician practitioner and as supervising physician I was immediately available for consultation/collaboration. I have reviewed all notes and am in agreement with care and plan.   Akaysha Cobern S Sharnika Binney, MD 07/20/11 1246 

## 2011-08-25 ENCOUNTER — Encounter (HOSPITAL_COMMUNITY): Payer: Self-pay | Admitting: *Deleted

## 2011-08-26 ENCOUNTER — Encounter (HOSPITAL_COMMUNITY): Payer: Self-pay | Admitting: Pharmacy Technician

## 2011-08-27 ENCOUNTER — Encounter (HOSPITAL_COMMUNITY)
Admission: RE | Admit: 2011-08-27 | Discharge: 2011-08-27 | Disposition: A | Discharge status: Home / Self Care | Payer: Self-pay | Source: Ambulatory Visit | Attending: Orthopedic Surgery | Admitting: Orthopedic Surgery

## 2011-08-27 ENCOUNTER — Encounter (HOSPITAL_COMMUNITY): Payer: Self-pay

## 2011-08-27 LAB — COMPREHENSIVE METABOLIC PANEL
ALT: 8 U/L (ref 0–35)
AST: 11 U/L (ref 0–37)
Albumin: 4 g/dL (ref 3.5–5.2)
Alkaline Phosphatase: 73 U/L (ref 39–117)
BUN: 4 mg/dL — ABNORMAL LOW (ref 6–23)
CO2: 30 mEq/L (ref 19–32)
Calcium: 9.1 mg/dL (ref 8.4–10.5)
Chloride: 99 mEq/L (ref 96–112)
Creatinine, Ser: 0.54 mg/dL (ref 0.50–1.10)
GFR calc Af Amer: >90 mL/min (ref >90)
GFR calc non Af Amer: >90 mL/min (ref >90)
Glucose, Bld: 89 mg/dL (ref 70–99)
Potassium: 3.6 mEq/L (ref 3.5–5.1)
Sodium: 139 mEq/L (ref 135–145)
Total Bilirubin: 0.2 mg/dL — ABNORMAL LOW (ref 0.3–1.2)
Total Protein: 6.9 g/dL (ref 6.0–8.3)

## 2011-08-27 LAB — CBC
HCT: 41.2 % (ref 36.0–46.0)
Hemoglobin: 14 g/dL (ref 12.0–15.0)
MCH: 32.7 pg (ref 26.0–34.0)
MCHC: 34 g/dL (ref 30.0–36.0)
MCV: 96.3 fL (ref 78.0–100.0)
Platelets: 248 10*3/uL (ref 150–400)
RBC: 4.28 MIL/uL (ref 3.87–5.11)
RDW: 13.9 % (ref 11.5–15.5)
WBC: 11 10*3/uL — ABNORMAL HIGH (ref 4.0–10.5)

## 2011-08-27 LAB — SURGICAL PCR SCREEN
MRSA, PCR: NEGATIVE
Staphylococcus aureus: POSITIVE — AB

## 2011-08-27 LAB — PROTIME-INR
INR: 1 (ref 0.00–1.49)
Prothrombin Time: 13.4 seconds (ref 11.6–15.2)

## 2011-08-27 MED ORDER — CEFAZOLIN SODIUM 1-5 GM-% IV SOLN
1.0000 g | INTRAVENOUS | Status: AC
  Administered 2011-08-28: 1 g via INTRAVENOUS
  Filled 2011-08-27: qty 50

## 2011-08-27 MED ORDER — LACTATED RINGERS IV SOLN: INTRAVENOUS | Status: DC

## 2011-08-27 NOTE — Pre-Procedure Instructions (Signed)
20 [name]  08/27/2011   Your procedure is scheduled on:  Fri, May 31 @ 7:30 AM  Report to [facility] at 5:30 AM.  Call this number if you have problems the morning of surgery: [phone]   Remember:   Do not eat food:After Midnight.  May have clear liquids: up to 4 Hours before arrival.(until 1:30 AM)  Clear liquids include soda, tea, black coffee, apple or grape juice, broth,water  Take these medicines the morning of surgery with A SIP OF WATER: Albuterol<Bring Your Inhaler With You>,Alprazolam(Xanax),Diazepam(Valium),Ritalin(Methylphenidate),and Pain Pill(if needed)   Do not wear jewelry, make-up or nail polish.  Do not wear lotions, powders, or perfumes.   Do not shave 48 hours prior to surgery.   Do not bring valuables to the hospital.  Contacts, dentures or bridgework may not be worn into surgery.  Leave suitcase in the car. After surgery it may be brought to your room.  For patients admitted to the hospital, checkout time is 11:00 AM the day of discharge.   Special Instructions: CHG Shower Use Special Wash: 1/2 bottle night before surgery and 1/2 bottle morning of surgery.   Please read over the following fact sheets that you were given: Pain Booklet, Coughing and Deep Breathing, MRSA Information and Surgical Site Infection Prevention

## 2011-08-28 ENCOUNTER — Ambulatory Visit (HOSPITAL_COMMUNITY): Payer: Self-pay

## 2011-08-28 ENCOUNTER — Encounter (HOSPITAL_COMMUNITY): Payer: Self-pay | Admitting: Certified Registered"

## 2011-08-28 ENCOUNTER — Ambulatory Visit (HOSPITAL_COMMUNITY)
Admission: RE | Admit: 2011-08-28 | Discharge: 2011-08-28 | Disposition: A | Discharge status: Home / Self Care | Payer: Self-pay | Source: Ambulatory Visit | Attending: Orthopedic Surgery | Admitting: Orthopedic Surgery

## 2011-08-28 ENCOUNTER — Encounter (HOSPITAL_COMMUNITY)
Admission: RE | Disposition: A | Discharge status: Home / Self Care | Payer: Self-pay | Source: Ambulatory Visit | Attending: Orthopedic Surgery

## 2011-08-28 ENCOUNTER — Encounter (HOSPITAL_COMMUNITY): Payer: Self-pay | Admitting: *Deleted

## 2011-08-28 ENCOUNTER — Ambulatory Visit (HOSPITAL_COMMUNITY): Payer: Self-pay | Admitting: Certified Registered"

## 2011-08-28 DIAGNOSIS — K219 Gastro-esophageal reflux disease without esophagitis: Secondary | ICD-10-CM | POA: Insufficient documentation

## 2011-08-28 DIAGNOSIS — IMO0002 Reserved for concepts with insufficient information to code with codable children: Secondary | ICD-10-CM | POA: Insufficient documentation

## 2011-08-28 DIAGNOSIS — F17200 Nicotine dependence, unspecified, uncomplicated: Secondary | ICD-10-CM | POA: Insufficient documentation

## 2011-08-28 DIAGNOSIS — F411 Generalized anxiety disorder: Secondary | ICD-10-CM | POA: Insufficient documentation

## 2011-08-28 DIAGNOSIS — Z01812 Encounter for preprocedural laboratory examination: Secondary | ICD-10-CM | POA: Insufficient documentation

## 2011-08-28 DIAGNOSIS — Z8541 Personal history of malignant neoplasm of cervix uteri: Secondary | ICD-10-CM | POA: Insufficient documentation

## 2011-08-28 HISTORY — PX: SACRO-ILIAC PINNING: SHX5050

## 2011-08-28 HISTORY — DX: Bell's palsy: G51.0

## 2011-08-28 HISTORY — DX: Urinary tract infection, site not specified: N39.0

## 2011-08-28 HISTORY — DX: Anxiety disorder, unspecified: F41.9

## 2011-08-28 HISTORY — DX: Gastro-esophageal reflux disease without esophagitis: K21.9

## 2011-08-28 SURGERY — PINNING, SACROILIAC JOINT, PERCUTANEOUS
Anesthesia: General | Site: Hip | Laterality: Left | Wound class: Clean

## 2011-08-28 MED ORDER — LACTATED RINGERS IV SOLN
INTRAVENOUS | Status: DC | PRN
  Administered 2011-08-28: 07:00:00 via INTRAVENOUS

## 2011-08-28 MED ORDER — FENTANYL CITRATE 0.05 MG/ML IJ SOLN
INTRAMUSCULAR | Status: DC | PRN
  Administered 2011-08-28: 50 ug via INTRAVENOUS
  Administered 2011-08-28: 100 ug via INTRAVENOUS

## 2011-08-28 MED ORDER — ONDANSETRON HCL 4 MG/2ML IJ SOLN: 4.0000 mg | Freq: Four times a day (QID) | INTRAMUSCULAR | Status: DC | PRN

## 2011-08-28 MED ORDER — PROPOFOL 10 MG/ML IV EMUL
INTRAVENOUS | Status: DC | PRN
  Administered 2011-08-28: 200 mg via INTRAVENOUS

## 2011-08-28 MED ORDER — NEOSTIGMINE METHYLSULFATE 1 MG/ML IJ SOLN
INTRAMUSCULAR | Status: DC | PRN
  Administered 2011-08-28: 2.5 mg via INTRAVENOUS

## 2011-08-28 MED ORDER — OXYCODONE-ACETAMINOPHEN 10-325 MG PO TABS: 1.0000 | ORAL_TABLET | Freq: Three times a day (TID) | ORAL | Status: DC | PRN

## 2011-08-28 MED ORDER — AMOXICILLIN-POT CLAVULANATE 875-125 MG PO TABS: 1.0000 | ORAL_TABLET | Freq: Two times a day (BID) | ORAL | Status: AC

## 2011-08-28 MED ORDER — MIDAZOLAM HCL 5 MG/5ML IJ SOLN
INTRAMUSCULAR | Status: DC | PRN
  Administered 2011-08-28: 1 mg via INTRAVENOUS

## 2011-08-28 MED ORDER — ROCURONIUM BROMIDE 100 MG/10ML IV SOLN
INTRAVENOUS | Status: DC | PRN
  Administered 2011-08-28: 50 mg via INTRAVENOUS

## 2011-08-28 MED ORDER — HYDROMORPHONE HCL PF 1 MG/ML IJ SOLN
INTRAMUSCULAR | Status: AC
  Filled 2011-08-28: qty 1

## 2011-08-28 MED ORDER — LIDOCAINE HCL (CARDIAC) 20 MG/ML IV SOLN
INTRAVENOUS | Status: DC | PRN
  Administered 2011-08-28: 70 mg via INTRAVENOUS

## 2011-08-28 MED ORDER — ONDANSETRON HCL 4 MG/2ML IJ SOLN
INTRAMUSCULAR | Status: DC | PRN
  Administered 2011-08-28: 4 mg via INTRAVENOUS

## 2011-08-28 MED ORDER — HYDROMORPHONE HCL PF 1 MG/ML IJ SOLN
0.2500 mg | INTRAMUSCULAR | Status: DC | PRN
  Administered 2011-08-28 (×5): 0.5 mg via INTRAVENOUS

## 2011-08-28 MED ORDER — GLYCOPYRROLATE 0.2 MG/ML IJ SOLN
INTRAMUSCULAR | Status: DC | PRN
  Administered 2011-08-28: .3 mg via INTRAVENOUS

## 2011-08-28 SURGICAL SUPPLY — 40 items
BLADE SURG 15 STRL LF DISP TIS (BLADE) ×1 IMPLANT
BLADE SURG 15 STRL SS (BLADE) ×2
BRUSH SCRUB DISP (MISCELLANEOUS) ×4 IMPLANT
CLOTH BEACON ORANGE TIMEOUT ST (SAFETY) ×2 IMPLANT
COVER SURGICAL LIGHT HANDLE (MISCELLANEOUS) ×4 IMPLANT
DRAPE C-ARM 42X72 X-RAY (DRAPES) IMPLANT
DRAPE C-ARMOR (DRAPES) ×2 IMPLANT
DRAPE INCISE IOBAN 66X45 STRL (DRAPES) ×2 IMPLANT
DRAPE LAPAROTOMY TRNSV 102X78 (DRAPE) ×2 IMPLANT
DRAPE U-SHAPE 47X51 STRL (DRAPES) ×2 IMPLANT
DRSG MEPILEX BORDER 4X4 (GAUZE/BANDAGES/DRESSINGS) ×1 IMPLANT
DRSG PAD ABDOMINAL 8X10 ST (GAUZE/BANDAGES/DRESSINGS) ×4 IMPLANT
ELECT REM PT RETURN 9FT ADLT (ELECTROSURGICAL) ×2
ELECTRODE REM PT RTRN 9FT ADLT (ELECTROSURGICAL) ×1 IMPLANT
GAUZE XEROFORM 5X9 LF (GAUZE/BANDAGES/DRESSINGS) ×2 IMPLANT
GLOVE BIO SURGEON STRL SZ7.5 (GLOVE) ×2 IMPLANT
GLOVE BIO SURGEON STRL SZ8 (GLOVE) ×2 IMPLANT
GLOVE BIOGEL PI IND STRL 7.5 (GLOVE) ×1 IMPLANT
GLOVE BIOGEL PI IND STRL 8 (GLOVE) ×1 IMPLANT
GLOVE BIOGEL PI INDICATOR 7.5 (GLOVE) ×1
GLOVE BIOGEL PI INDICATOR 8 (GLOVE) ×1
GOWN PREVENTION PLUS XLARGE (GOWN DISPOSABLE) ×2 IMPLANT
GOWN STRL NON-REIN LRG LVL3 (GOWN DISPOSABLE) ×4 IMPLANT
GUIDEWIRE THREADED 2.8 (WIRE) ×1 IMPLANT
KIT BASIN OR (CUSTOM PROCEDURE TRAY) ×2 IMPLANT
KIT ROOM TURNOVER OR (KITS) ×2 IMPLANT
MANIFOLD NEPTUNE II (INSTRUMENTS) ×2 IMPLANT
NS IRRIG 1000ML POUR BTL (IV SOLUTION) ×2 IMPLANT
PACK GENERAL/GYN (CUSTOM PROCEDURE TRAY) ×2 IMPLANT
PAD ARMBOARD 7.5X6 YLW CONV (MISCELLANEOUS) ×4 IMPLANT
SCREW CANN 32 THRD/100 7.3 (Screw) ×1 IMPLANT
SPONGE GAUZE 4X4 12PLY (GAUZE/BANDAGES/DRESSINGS) ×2 IMPLANT
STAPLER VISISTAT 35W (STAPLE) ×2 IMPLANT
SUT ETHILON 3 0 PS 1 (SUTURE) ×2 IMPLANT
SUT VIC AB 2-0 FS1 27 (SUTURE) ×2 IMPLANT
TOWEL OR 17X24 6PK STRL BLUE (TOWEL DISPOSABLE) ×2 IMPLANT
TOWEL OR 17X26 10 PK STRL BLUE (TOWEL DISPOSABLE) ×4 IMPLANT
UNDERPAD 30X30 INCONTINENT (UNDERPADS AND DIAPERS) ×2 IMPLANT
WASHER FOR 5.0 SCREWS (Washer) ×1 IMPLANT
WATER STERILE IRR 1000ML POUR (IV SOLUTION) ×2 IMPLANT

## 2011-08-28 NOTE — Anesthesia Preprocedure Evaluation (Addendum)
Anesthesia Evaluation  Patient identified by MRN, date of birth, ID band Patient awake    Reviewed: Allergy & Precautions, H&P , NPO status , Patient's Chart, lab work & pertinent test results  Airway Mallampati: II  Neck ROM: full    Dental  (+) Teeth Intact and Dental Advisory Given   Pulmonary Current Smoker,  breath sounds clear to auscultation        Cardiovascular Rhythm:Regular Rate:Normal     Neuro/Psych PSYCHIATRIC DISORDERS Anxiety    GI/Hepatic GERD-  ,  Endo/Other    Renal/GU      Musculoskeletal   Abdominal   Peds  Hematology   Anesthesia Other Findings   Reproductive/Obstetrics                          Anesthesia Physical Anesthesia Plan  ASA: II  Anesthesia Plan: General   Post-op Pain Management:    Induction: Intravenous  Airway Management Planned: Oral ETT  Additional Equipment:   Intra-op Plan:   Post-operative Plan: Extubation in OR  Informed Consent: I have reviewed the patients History and Physical, chart, labs and discussed the procedure including the risks, benefits and alternatives for the proposed anesthesia with the patient or authorized representative who has indicated his/her understanding and acceptance.     Plan Discussed with: CRNA and Surgeon  Anesthesia Plan Comments:         Anesthesia Quick Evaluation

## 2011-08-28 NOTE — Transfer of Care (Signed)
Immediate Anesthesia Transfer of Care Note  Patient: [name]  Procedure(s) Performed: Procedure(s) (LRB): SACRO-ILIAC PINNING (Left)  Patient Location: PACU  Anesthesia Type: General  Level of Consciousness: awake, alert  and oriented  Airway & Oxygen Therapy: Patient Spontanous Breathing and Patient connected to nasal cannula oxygen  Post-op Assessment: Report given to PACU RN  Post vital signs: Reviewed and stable  Complications: No apparent anesthesia complications

## 2011-08-28 NOTE — Brief Op Note (Signed)
08/28/2011  9:13 AM  PATIENT:  [name]  26 y.o. female  PRE-OPERATIVE DIAGNOSIS:  NONUNION LEFT SACRUM   POST-OPERATIVE DIAGNOSIS:  NONUNION LEFT SACRUM   PROCEDURE:  Procedure(s) (LRB): Repair left sacrum nonunion  SURGEON:  Surgeon(s) and Role:    * Budd Palmer, MD - Primary  ANESTHESIA:   general  EBL:  Total I/O In: 650 [I.V.:650] Out: -   BLOOD ADMINISTERED:none  DRAINS: none   LOCAL MEDICATIONS USED:  MARCAINE     SPECIMEN:  No Specimen  DISPOSITION OF SPECIMEN:  N/A  COUNTS:  YES  TOURNIQUET:  * No tourniquets in log *  DICTATION: .Other Dictation: Dictation Number [id number]  PLAN OF CARE: Admit to inpatient   PATIENT DISPOSITION:  PACU - hemodynamically stable.   Delay start of Pharmacological VTE agent (>24hrs) due to surgical blood loss or risk of bleeding: no

## 2011-08-28 NOTE — Discharge Instructions (Signed)
[facility] Discharge Instructions, Pin Site and Wound Care   General Discharge Instructions  WEIGHT BEARING STATUS:Weightbear as tolerated  RANGE OF MOTION/ACTIVITY:Range of motion as tolerated  Diet: as you were eating previously.  Can use over the counter stool softeners and bowel preparations, such as Miralax, to help with bowel movements.  Narcotics can be constipating.  Be sure to drink plenty of fluids  STOP SMOKING OR USING NICOTINE PRODUCTS!!!!  As discussed nicotine severely impairs your body's ability to heal surgical and traumatic wounds but also impairs bone healing.  Wounds and bone heal by forming microscopic blood vessels (angiogenesis) and nicotine is a vasoconstrictor (essentially, shrinks blood vessels).  Therefore, if vasoconstriction occurs to these microscopic blood vessels they essentially disappear and are unable to deliver necessary nutrients to the healing tissue.  This is one modifiable factor that you can do to dramatically increase your chances of healing your injury.    (This means no smoking, no nicotine gum, patches, etc)  DO NOT USE NONSTEROIDAL ANTI-INFLAMMATORY DRUGS (NSAID'S)  Using products such as Advil (ibuprofen), Aleve (naproxen), Motrin (ibuprofen) for additional pain control during fracture healing can delay and/or prevent the healing response.  If you would like to take over the counter (OTC) medication, Tylenol (acetaminophen) is ok.  However, some narcotic medications that are given for pain control contain acetaminophen as well. Therefore, you should not exceed more than 4000 mg of tylenol in a day if you do not have liver disease.  Also note that there are may OTC medicines, such as cold medicines and allergy medicines that my contain tylenol as well.  If you have any questions about medications and/or interactions please ask your doctor/PA or your pharmacist.   PAIN MEDICATION USE AND EXPECTATIONS  You have likely been given narcotic  medications to help control your pain.  After a traumatic event that results in an fracture (broken bone) with or without surgery, it is ok to use narcotic pain medications to help control one's pain.  We understand that everyone responds to pain differently and each individual patient will be evaluated on a regular basis for the continued need for narcotic medications. Ideally, narcotic medication use should last no more than 6-8 weeks (coinciding with fracture healing).   As a patient it is your responsibility as well to monitor narcotic medication use and report the amount and frequency you use these medications when you come to your office visit.   We would also advise that if you are using narcotic medications, you should take a dose prior to therapy to maximize you participation.  IF YOU ARE ON NARCOTIC MEDICATIONS IT IS NOT PERMISSIBLE TO OPERATE A MOTOR VEHICLE (MOTORCYCLE/CAR/TRUCK/MOPED) OR HEAVY MACHINERY DO NOT MIX NARCOTICS WITH OTHER CNS (CENTRAL NERVOUS SYSTEM) DEPRESSANTS SUCH AS ALCOHOL       ICE AND ELEVATE INJURED/OPERATIVE EXTREMITY  Using ice and elevating the injured extremity above your heart can help with swelling and pain control.  Icing in a pulsatile fashion, such as 20 minutes on and 20 minutes off, can be followed.    Do not place ice directly on skin. Make sure there is a barrier between to skin and the ice pack.    Using frozen items such as frozen peas works well as the conform nicely to the are that needs to be iced.  USE AN ACE WRAP OR TED HOSE FOR SWELLING CONTROL  In addition to icing and elevation, Ace wraps or TED hose are used to help limit  and resolve swelling.  It is recommended to use Ace wraps or TED hose until you are informed to stop.    When using Ace Wraps start the wrapping distally (farthest away from the body) and wrap proximally (closer to the body)   Example: If you had surgery on your leg or thing and you do not have a splint on, start the ace  wrap at the toes and work your way up to the thigh        If you had surgery on your upper extremity and do not have a splint on, start the ace wrap at your fingers and work your way up to the upper arm  IF YOU ARE IN A SPLINT OR CAST DO NOT REMOVE IT FOR ANY REASON   If your splint gets wet for any reason please contact the office immediately. You may shower in your splint or cast as long as you keep it dry.  This can be done by wrapping in a cast cover or garbage back (or similar)  Do Not stick any thing down your splint or cast such as pencils, money, or hangers to try and scratch yourself with.  If you feel itchy take benadryl as prescribed on the bottle for itching  CALL THE OFFICE WITH ANY QUESTIONS OR CONCERTS: [phone]     Discharge Pin Site Instructions  Dress pins daily with Kerlix roll starting on POD 2. Wrap the Kerlix so that it tamps the skin down around the pin-skin interface to prevent/limit motion of the skin relative to the pin.  (Pin-skin motion is the primary cause of pain and infection related to external fixator pin sites).  Remove any crust or coagulum that may obstruct drainage with a saline moistened gauze or soap and water.  After POD 3, if there is no discernable drainage on the pin site dressing, the interval for change can by increased to every other day.  You may shower with the fixator, cleaning all pin sites gently with soap and water.  If you have a surgical wound this needs to be completely dry and without drainage before showering.  The extremity can be lifted by the fixator to facilitate wound care and transfers.  Notify the office/Doctor if you experience increasing drainage, redness, or pain from a pin site, or if you notice purulent (thick, snot-like) drainage.  Discharge Wound Care Instructions  Do NOT apply any ointments, solutions or lotions to pin sites or surgical wounds.  These prevent needed drainage and even though solutions like hydrogen  peroxide kill bacteria, they also damage cells lining the pin sites that help fight infection.  Applying lotions or ointments can keep the wounds moist and can cause them to breakdown and open up as well. This can increase the risk for infection. When in doubt call the office.  Surgical incisions should be dressed daily.  If any drainage is noted, use one layer of adaptic, then gauze, Kerlix, and an ace wrap.  Once the incision is completely dry and without drainage, it may be left open to air out.  Showering may begin 36-48 hours later.  Cleaning gently with soap and water.  Traumatic wounds should be dressed daily as well.    One layer of adaptic, gauze, Kerlix, then ace wrap.  The adaptic can be discontinued once the draining has ceased    If you have a wet to dry dressing: wet the gauze with saline the squeeze as much saline out so the gauze is  moist (not soaking wet), place moistened gauze over wound, then place a dry gauze over the moist one, followed by Kerlix wrap, then ace wrap.

## 2011-08-28 NOTE — Progress Notes (Signed)
Informed patient that we are no longer able to discharge through Short Stay entrance. She refused to be wheeled out through [facility]. Patient stated she would just walk out and that she was not going all over the hospital to be discharged.  I asked patient to sign an against medical advice form and she refused. Her boyfriend proceeded to obtain a wheelchair and wheel her out himself.

## 2011-08-28 NOTE — Preoperative (Signed)
Beta Blockers   Reason not to administer Beta Blockers:Not Applicable 

## 2011-08-28 NOTE — Anesthesia Postprocedure Evaluation (Signed)
Anesthesia Post Note  Patient: [name]  Procedure(s) Performed: Procedure(s) (LRB): SACRO-ILIAC PINNING (Left)  Anesthesia type: General  Patient location: PACU  Post pain: Pain level controlled and Adequate analgesia  Post assessment: Post-op Vital signs reviewed, Patient's Cardiovascular Status Stable, Respiratory Function Stable, Patent Airway and Pain level controlled  Last Vitals:  Filed Vitals:   08/28/11 1000  BP: 93/66  Pulse: 66  Temp:   Resp: 15    Post vital signs: Reviewed and stable  Level of consciousness: awake, alert  and oriented  Complications: No apparent anesthesia complications
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
## 2011-08-28 NOTE — Anesthesia Procedure Notes (Signed)
Procedure Name: Intubation Date/Time: 08/28/2011 8:10 AM Performed by: Jefm Miles E Pre-anesthesia Checklist: Patient identified, Timeout performed, Emergency Drugs available, Suction available and Patient being monitored Patient Re-evaluated:Patient Re-evaluated prior to inductionOxygen Delivery Method: Circle system utilized Preoxygenation: Pre-oxygenation with 100% oxygen Intubation Type: IV induction Ventilation: Mask ventilation without difficulty Laryngoscope Size: Mac and 3 Grade View: Grade I Tube type: Oral Tube size: 7.0 mm Number of attempts: 1 Airway Equipment and Method: Stylet Placement Confirmation: ETT inserted through vocal cords under direct vision,  positive ETCO2 and breath sounds checked- equal and bilateral Secured at: 21 cm Tube secured with: Tape Dental Injury: Teeth and Oropharynx as per pre-operative assessment

## 2011-08-28 NOTE — H&P (Signed)
I have seen and examined the patient. I agree with the findings above. I discussed with the patient the risks and benefits of surgery, including the possibility of infection, nerve injury, vessel injury, wound breakdown, arthritis, symptomatic hardware, DVT/ PE, loss of motion, persistent nonunion, and need for further surgery among others.  She understands these risks and wishes to proceed.  Budd Palmer, MD 08/28/2011 8:01 AM
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
## 2011-08-31 ENCOUNTER — Encounter (HOSPITAL_COMMUNITY): Payer: Self-pay | Admitting: Orthopedic Surgery

## 2011-08-31 LAB — NICOTINE/COTININE METABOLITES: Cotinine: >100 ng/mL

## 2011-09-02 LAB — DRUG SCREEN PANEL (SERUM)

## 2011-09-03 LAB — URINE DRUGS OF ABUSE SCREEN W ALC, ROUTINE (REF LAB)
Amphetamine Screen, Ur: NEGATIVE
Barbiturate Quant, Ur: NEGATIVE
Benzodiazepines.: POSITIVE — AB
Cocaine Metabolites: NEGATIVE
Creatinine,U: 98.3 mg/dL
Ethyl Alcohol: <10 mg/dL (ref <10)
Marijuana Metabolite: NEGATIVE
Methadone: NEGATIVE
Opiate Screen, Urine: POSITIVE — AB
Phencyclidine (PCP): NEGATIVE
Propoxyphene: NEGATIVE

## 2011-11-12 ENCOUNTER — Emergency Department (HOSPITAL_COMMUNITY)
Admission: EM | Admit: 2011-11-12 | Discharge: 2011-11-12 | Disposition: A | Discharge status: Home / Self Care | Payer: Self-pay | Attending: Emergency Medicine | Admitting: Emergency Medicine

## 2011-11-12 ENCOUNTER — Emergency Department (HOSPITAL_COMMUNITY): Payer: Self-pay

## 2011-11-12 ENCOUNTER — Encounter (HOSPITAL_COMMUNITY): Payer: Self-pay | Admitting: Emergency Medicine

## 2011-11-12 DIAGNOSIS — Y9301 Activity, walking, marching and hiking: Secondary | ICD-10-CM | POA: Insufficient documentation

## 2011-11-12 DIAGNOSIS — Y998 Other external cause status: Secondary | ICD-10-CM | POA: Insufficient documentation

## 2011-11-12 DIAGNOSIS — F17200 Nicotine dependence, unspecified, uncomplicated: Secondary | ICD-10-CM | POA: Insufficient documentation

## 2011-11-12 DIAGNOSIS — K219 Gastro-esophageal reflux disease without esophagitis: Secondary | ICD-10-CM | POA: Insufficient documentation

## 2011-11-12 DIAGNOSIS — W19XXXA Unspecified fall, initial encounter: Secondary | ICD-10-CM

## 2011-11-12 DIAGNOSIS — S7000XA Contusion of unspecified hip, initial encounter: Secondary | ICD-10-CM | POA: Insufficient documentation

## 2011-11-12 DIAGNOSIS — Z8541 Personal history of malignant neoplasm of cervix uteri: Secondary | ICD-10-CM | POA: Insufficient documentation

## 2011-11-12 DIAGNOSIS — Y92009 Unspecified place in unspecified non-institutional (private) residence as the place of occurrence of the external cause: Secondary | ICD-10-CM | POA: Insufficient documentation

## 2011-11-12 DIAGNOSIS — W010XXA Fall on same level from slipping, tripping and stumbling without subsequent striking against object, initial encounter: Secondary | ICD-10-CM | POA: Insufficient documentation

## 2011-11-12 DIAGNOSIS — S7002XA Contusion of left hip, initial encounter: Secondary | ICD-10-CM

## 2011-11-12 MED ORDER — FENTANYL CITRATE 0.05 MG/ML IJ SOLN
50.0000 ug | Freq: Once | INTRAMUSCULAR | Status: AC
  Administered 2011-11-12: 50 ug via INTRAVENOUS
  Filled 2011-11-12: qty 2

## 2011-11-12 MED ORDER — SODIUM CHLORIDE 0.9 % IV SOLN
Freq: Once | INTRAVENOUS | Status: AC
  Administered 2011-11-12: 01:00:00 via INTRAVENOUS

## 2011-11-12 MED ORDER — OXYCODONE-ACETAMINOPHEN 5-325 MG PO TABS: 1.0000 | ORAL_TABLET | Freq: Four times a day (QID) | ORAL | Status: DC | PRN

## 2011-11-12 NOTE — ED Notes (Signed)
Brought in by EMS from home with c/o left hip pain after her fall tonight. Per EMS, pt was walking on her front porch concrete stairs when she lost balance and fell, pt landed on her left hip and pt had immediate pain after the fall.  Pt was given Dilaudid 2 mg IV en route to ED.

## 2011-11-12 NOTE — ED Notes (Signed)
Pt denies hitting head when she fell. Denies headache, dizziness or nausea.

## 2011-11-12 NOTE — ED Notes (Signed)
[id number]<AV> Expected date:<BR> Expected time:<BR> Means of arrival:<BR> Comments:<BR> EMS/fall-hip pain/IV Dilaudid
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
## 2011-11-14 NOTE — ED Provider Notes (Signed)
Medical screening examination/treatment/procedure(s) were conducted as a shared visit with non-physician practitioner(s) and myself.  I personally evaluated the patient during the encounter  Xray without fracture. Pt with hx of chronic pain and recently lost relationship with her primary pain physician. Hx of chronic pain, no longer on oxycodone. Full ROM of bilateral hips. Normal pulses and motor in lower extremities. Home with short course of opioid meds. Strong recommendation for pain MD follow up  Lyanne Co, MD 11/14/11 [phone]

## 2011-11-16 ENCOUNTER — Emergency Department (HOSPITAL_COMMUNITY)
Admission: EM | Admit: 2011-11-16 | Discharge: 2011-11-16 | Disposition: A | Discharge status: Home / Self Care | Payer: Self-pay | Attending: Emergency Medicine | Admitting: Emergency Medicine

## 2011-11-16 ENCOUNTER — Encounter (HOSPITAL_COMMUNITY): Payer: Self-pay | Admitting: *Deleted

## 2011-11-16 DIAGNOSIS — Z8541 Personal history of malignant neoplasm of cervix uteri: Secondary | ICD-10-CM | POA: Insufficient documentation

## 2011-11-16 DIAGNOSIS — K029 Dental caries, unspecified: Secondary | ICD-10-CM | POA: Insufficient documentation

## 2011-11-16 DIAGNOSIS — F17200 Nicotine dependence, unspecified, uncomplicated: Secondary | ICD-10-CM | POA: Insufficient documentation

## 2011-11-16 DIAGNOSIS — K0889 Other specified disorders of teeth and supporting structures: Secondary | ICD-10-CM

## 2011-11-16 DIAGNOSIS — K219 Gastro-esophageal reflux disease without esophagitis: Secondary | ICD-10-CM | POA: Insufficient documentation

## 2011-11-16 MED ORDER — IBUPROFEN 800 MG PO TABS
800.0000 mg | ORAL_TABLET | Freq: Once | ORAL | Status: AC
  Administered 2011-11-16: 800 mg via ORAL
  Filled 2011-11-16: qty 1

## 2011-11-16 MED ORDER — PENICILLIN V POTASSIUM 500 MG PO TABS: 500.0000 mg | ORAL_TABLET | Freq: Four times a day (QID) | ORAL | Status: AC

## 2011-11-16 MED ORDER — HYDROCODONE-ACETAMINOPHEN 5-325 MG PO TABS: 1.0000 | ORAL_TABLET | Freq: Four times a day (QID) | ORAL | Status: AC | PRN

## 2011-11-16 MED ORDER — HYDROCOD POLST-CHLORPHEN POLST 10-8 MG/5ML PO LQCR
5.0000 mL | Freq: Once | ORAL | Status: AC
  Administered 2011-11-16: 5 mL via ORAL
  Filled 2011-11-16: qty 5

## 2011-11-16 MED ORDER — PENICILLIN V POTASSIUM 250 MG PO TABS
500.0000 mg | ORAL_TABLET | Freq: Once | ORAL | Status: AC
  Administered 2011-11-16: 500 mg via ORAL
  Filled 2011-11-16: qty 2

## 2011-11-16 NOTE — ED Provider Notes (Signed)
Medical screening examination/treatment/procedure(s) were performed by non-physician practitioner and as supervising physician I was immediately available for consultation/collaboration.  Sunnie Nielsen, MD 11/16/11 [phone]
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
## 2011-11-16 NOTE — ED Notes (Signed)
Pt c/o pain to tooth on lower left side.

## 2011-12-28 ENCOUNTER — Encounter (HOSPITAL_COMMUNITY): Payer: Self-pay | Admitting: *Deleted

## 2011-12-28 ENCOUNTER — Emergency Department (HOSPITAL_COMMUNITY)
Admission: EM | Admit: 2011-12-28 | Discharge: 2011-12-28 | Disposition: A | Discharge status: Home / Self Care | Payer: Self-pay | Attending: Emergency Medicine | Admitting: Emergency Medicine

## 2011-12-28 ENCOUNTER — Emergency Department (HOSPITAL_COMMUNITY): Payer: Self-pay

## 2011-12-28 DIAGNOSIS — S9032XA Contusion of left foot, initial encounter: Secondary | ICD-10-CM

## 2011-12-28 DIAGNOSIS — Z888 Allergy status to other drugs, medicaments and biological substances status: Secondary | ICD-10-CM | POA: Insufficient documentation

## 2011-12-28 DIAGNOSIS — M25552 Pain in left hip: Secondary | ICD-10-CM

## 2011-12-28 DIAGNOSIS — M25559 Pain in unspecified hip: Secondary | ICD-10-CM | POA: Insufficient documentation

## 2011-12-28 DIAGNOSIS — S9030XA Contusion of unspecified foot, initial encounter: Secondary | ICD-10-CM | POA: Insufficient documentation

## 2011-12-28 DIAGNOSIS — W19XXXA Unspecified fall, initial encounter: Secondary | ICD-10-CM

## 2011-12-28 DIAGNOSIS — K219 Gastro-esophageal reflux disease without esophagitis: Secondary | ICD-10-CM | POA: Insufficient documentation

## 2011-12-28 DIAGNOSIS — F411 Generalized anxiety disorder: Secondary | ICD-10-CM | POA: Insufficient documentation

## 2011-12-28 DIAGNOSIS — F17200 Nicotine dependence, unspecified, uncomplicated: Secondary | ICD-10-CM | POA: Insufficient documentation

## 2011-12-28 MED ORDER — OXYCODONE-ACETAMINOPHEN 5-325 MG PO TABS: 1.0000 | ORAL_TABLET | ORAL | Status: AC | PRN

## 2011-12-28 MED ORDER — OXYCODONE-ACETAMINOPHEN 5-325 MG PO TABS
2.0000 | ORAL_TABLET | Freq: Once | ORAL | Status: AC
  Administered 2011-12-28: 2 via ORAL
  Filled 2011-12-28: qty 2

## 2011-12-28 NOTE — ED Notes (Signed)
J. Idol, PA at bedside. 

## 2011-12-28 NOTE — ED Notes (Signed)
Pt requesting crutches once discharged

## 2011-12-28 NOTE — ED Notes (Signed)
Patient transported to X-ray 
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
## 2012-01-03 NOTE — ED Provider Notes (Signed)
Medical screening examination/treatment/procedure(s) were performed by non-physician practitioner and as supervising physician I was immediately available for consultation/collaboration.    Shelda Jakes, MD 01/03/12 2211

## 2012-01-30 ENCOUNTER — Emergency Department (HOSPITAL_COMMUNITY): Payer: Self-pay

## 2012-01-30 ENCOUNTER — Encounter (HOSPITAL_COMMUNITY): Payer: Self-pay | Admitting: Emergency Medicine

## 2012-01-30 ENCOUNTER — Emergency Department (HOSPITAL_COMMUNITY)
Admission: EM | Admit: 2012-01-30 | Discharge: 2012-01-30 | Disposition: A | Discharge status: Home / Self Care | Payer: Self-pay | Attending: Emergency Medicine | Admitting: Emergency Medicine

## 2012-01-30 DIAGNOSIS — Z87442 Personal history of urinary calculi: Secondary | ICD-10-CM | POA: Insufficient documentation

## 2012-01-30 DIAGNOSIS — M545 Low back pain, unspecified: Secondary | ICD-10-CM | POA: Insufficient documentation

## 2012-01-30 DIAGNOSIS — Z5189 Encounter for other specified aftercare: Secondary | ICD-10-CM | POA: Insufficient documentation

## 2012-01-30 DIAGNOSIS — K219 Gastro-esophageal reflux disease without esophagitis: Secondary | ICD-10-CM | POA: Insufficient documentation

## 2012-01-30 DIAGNOSIS — F411 Generalized anxiety disorder: Secondary | ICD-10-CM | POA: Insufficient documentation

## 2012-01-30 DIAGNOSIS — N390 Urinary tract infection, site not specified: Secondary | ICD-10-CM | POA: Insufficient documentation

## 2012-01-30 DIAGNOSIS — Z888 Allergy status to other drugs, medicaments and biological substances status: Secondary | ICD-10-CM | POA: Insufficient documentation

## 2012-01-30 DIAGNOSIS — R300 Dysuria: Secondary | ICD-10-CM | POA: Insufficient documentation

## 2012-01-30 DIAGNOSIS — Z8541 Personal history of malignant neoplasm of cervix uteri: Secondary | ICD-10-CM | POA: Insufficient documentation

## 2012-01-30 DIAGNOSIS — Z79899 Other long term (current) drug therapy: Secondary | ICD-10-CM | POA: Insufficient documentation

## 2012-01-30 DIAGNOSIS — F17200 Nicotine dependence, unspecified, uncomplicated: Secondary | ICD-10-CM | POA: Insufficient documentation

## 2012-01-30 HISTORY — DX: Calculus of kidney: N20.0

## 2012-01-30 LAB — PREGNANCY, URINE: Preg Test, Ur: NEGATIVE

## 2012-01-30 LAB — URINALYSIS, ROUTINE W REFLEX MICROSCOPIC
Bilirubin Urine: NEGATIVE
Glucose, UA: NEGATIVE mg/dL
Ketones, ur: NEGATIVE mg/dL
Leukocytes, UA: NEGATIVE
Nitrite: NEGATIVE
Protein, ur: NEGATIVE mg/dL
Specific Gravity, Urine: >1.03 — ABNORMAL HIGH (ref 1.005–1.030)
Urobilinogen, UA: 0.2 mg/dL (ref 0.0–1.0)
pH: 6 (ref 5.0–8.0)

## 2012-01-30 LAB — URINE MICROSCOPIC-ADD ON

## 2012-01-30 MED ORDER — OXYCODONE-ACETAMINOPHEN 5-325 MG PO TABS
2.0000 | ORAL_TABLET | Freq: Once | ORAL | Status: AC
  Administered 2012-01-30: 2 via ORAL
  Filled 2012-01-30: qty 2

## 2012-01-30 MED ORDER — OXYCODONE-ACETAMINOPHEN 5-325 MG PO TABS: ORAL_TABLET | ORAL | Status: DC

## 2012-01-30 MED ORDER — CEPHALEXIN 500 MG PO CAPS: 500.0000 mg | ORAL_CAPSULE | Freq: Four times a day (QID) | ORAL | Status: DC

## 2012-01-30 MED ORDER — HYDROMORPHONE HCL PF 1 MG/ML IJ SOLN
1.0000 mg | Freq: Once | INTRAMUSCULAR | Status: AC
  Administered 2012-01-30: 1 mg via INTRAMUSCULAR
  Filled 2012-01-30: qty 1

## 2012-01-30 NOTE — ED Notes (Signed)
Patient refusing CT at this time stating "I just paid 20,000 to [facility] for the surgeries I've had." Per patient no insurance. EDP made aware.

## 2012-01-30 NOTE — ED Provider Notes (Signed)
History     CSN: [id number]  Arrival date & time 01/30/12  1252   First MD Initiated Contact with Patient 01/30/12 1354      Chief Complaint  Patient presents with  . Abdominal Pain  . Back Pain     HPI Pt was seen at 1415.  Per pt, c/o gradual onset and persistence of constant right sided low back "pain" and dysuria for the past 2 days. Pt endorses she has a Hx of "kidney stones" and thinks these symptoms may be similar.  Denies vaginal bleeding/discharge, no fevers, no hematuria, no abd pain, no N/V/D, no CP/SOB.    Past Medical History  Diagnosis Date  . Blood transfusion   . Pelvic fracture   . Wound of buttock   . Pain in right buttock   . Right humeral fracture   . Bell's palsy     26 yrs old  . UTI (urinary tract infection)   . GERD (gastroesophageal reflux disease)     occ  . Anxiety   . Cervical cancer 2010    cervical/ s/p total hysterectomy  . Kidney stone     Past Surgical History  Procedure Date  . Fracture surgery   . Hip surgery   . Abdominal hysterectomy 2010    cervical cancer  . Fracture surgery     pelvis, rt arm  . Ankle arthroplasty     plates not replacement  . Bony pelvis surgery     fx pelvis, fixation  . Humerus fracture surgery   . Sacro-iliac pinning 08/28/2011    Procedure: SACRO-ILIAC PINNING;  Surgeon: Budd Palmer, MD;  Location: [facility] OR;  Service: Orthopedics;  Laterality: Left;  REPAIR OF LEFT SACRUM NONUNION     History  Substance Use Topics  . Smoking status: Current Some Day Smoker -- 0.5 packs/day for 12 years    Types: Cigarettes  . Smokeless tobacco: Not on file   Comment: teenager  . Alcohol Use: No      Review of Systems ROS: Statement: All systems negative except as marked or noted in the HPI; Constitutional: Negative for fever and chills. ; ; Eyes: Negative for eye pain, redness and discharge. ; ; ENMT: Negative for ear pain, hoarseness, nasal congestion, sinus pressure and sore throat. ; ; Cardiovascular:  Negative for chest pain, palpitations, diaphoresis, dyspnea and peripheral edema. ; ; Respiratory: Negative for cough, wheezing and stridor. ; ; Gastrointestinal: Negative for nausea, vomiting, diarrhea, abdominal pain, blood in stool, hematemesis, jaundice and rectal bleeding. . ; ; Genitourinary: +dysuria. Negative for flank pain and hematuria. ; ; GYN:  No vaginal bleeding, no vaginal discharge, no vulvar pain.;;  Musculoskeletal: +LBP. Negative for neck pain. Negative for swelling and trauma.; ; Skin: Negative for pruritus, rash, abrasions, blisters, bruising and skin lesion.; ; Neuro: Negative for headache, lightheadedness and neck stiffness. Negative for weakness, altered level of consciousness , altered mental status, extremity weakness, paresthesias, involuntary movement, seizure and syncope.       Allergies  Aleve; Aspirin-acetaminophen-caffeine; Metronidazole; and Naproxen sodium  Home Medications   Current Outpatient Rx  Name Route Sig Dispense Refill  . ACETAMINOPHEN 500 MG PO TABS Oral Take 1,000 mg by mouth every 6 (six) hours as needed. Pain    . ALBUTEROL SULFATE HFA 108 (90 BASE) MCG/ACT IN AERS Inhalation Inhale 2 puffs into the lungs every 6 (six) hours as needed. Chest tightness    . ALPRAZOLAM 1 MG PO TABS Oral Take 1 mg by  mouth 4 (four) times daily. scheduled    . IBUPROFEN 200 MG PO TABS Oral Take 600 mg by mouth daily as needed. For pain    . METHYLPHENIDATE HCL 10 MG PO TABS Oral Take 10 mg by mouth 3 (three) times daily.      BP 115/49  Pulse 120  Temp 98 F (36.7 C) (Oral)  Resp 20  Ht 5\' 3"  (1.6 m)  Wt 120 lb (54.432 kg)  BMI 21.26 kg/m2  SpO2 100%  Physical Exam 1420: Physical examination:  Nursing notes reviewed; Vital signs and O2 SAT reviewed;  Constitutional: Well developed, Well nourished, Well hydrated, In no acute distress; Head:  Normocephalic, atraumatic; Eyes: EOMI, PERRL, No scleral icterus; ENMT: Mouth and pharynx normal, Mucous membranes  moist; Neck: Supple, Full range of motion, No lymphadenopathy; Cardiovascular: Regular rate and rhythm, No murmur, rub, or gallop; Respiratory: Breath sounds clear & equal bilaterally, No rales, rhonchi, wheezes.  Speaking full sentences with ease, Normal respiratory effort/excursion; Chest: Nontender, Movement normal; Abdomen: Soft, Nontender, Nondistended, Normal bowel sounds; Genitourinary: No CVA tenderness; Spine:  No midline CS, TS, LS tenderness.  +TTP right lumbar paraspinal muscles;;  Extremities: Multiple surgical scars right hip area. Pulses normal, No tenderness, No edema, No calf edema or asymmetry.; Neuro: AA&Ox3, Major CN grossly intact.  Speech clear. No gross focal motor or sensory deficits in extremities.; Skin: Color normal, Warm, Dry.   ED Course  Procedures     MDM  MDM Reviewed: previous chart, nursing note and vitals Reviewed previous: CT scan and x-ray Interpretation: CT scan and labs   Results for orders placed during the hospital encounter of 01/30/12  URINALYSIS, ROUTINE W REFLEX MICROSCOPIC      Component Value Range   Color, Urine YELLOW  YELLOW   APPearance CLOUDY (*) CLEAR   Specific Gravity, Urine >1.030 (*) 1.005 - 1.030   pH 6.0  5.0 - 8.0   Glucose, UA NEGATIVE  NEGATIVE mg/dL   Hgb urine dipstick LARGE (*) NEGATIVE   Bilirubin Urine NEGATIVE  NEGATIVE   Ketones, ur NEGATIVE  NEGATIVE mg/dL   Protein, ur NEGATIVE  NEGATIVE mg/dL   Urobilinogen, UA 0.2  0.0 - 1.0 mg/dL   Nitrite NEGATIVE  NEGATIVE   Leukocytes, UA NEGATIVE  NEGATIVE  PREGNANCY, URINE      Component Value Range   Preg Test, Ur NEGATIVE  NEGATIVE  URINE MICROSCOPIC-ADD ON      Component Value Range   Squamous Epithelial / LPF FEW (*) RARE   WBC, UA 7-10  <3 WBC/hpf   RBC / HPF TOO NUMEROUS TO COUNT  <3 RBC/hpf   Bacteria, UA FEW (*) RARE     1520:  Pt states she does not does not want a CT scan or XR, but "wants to know if I have a kidney stone." Long d/w pt and significant  other regarding inability to conclusively tell her if she has a kidney stone without imaging study.  Continues to refuse imaging study, stating she "just wants some pain meds" and "wants to know if I have a kidney stone." Pt and family informed re: dx testing results, including my inability to tell her if she has a kidney stone as cause for her pain (hematuria can be due to UTI and/or ureteral calculi), and that I recommend having further imaging studies. Pt continues to refuse.  I encouraged pt, continues to refuse.  Pt makes her own medical decisions.  Pt now wants to go home.  Risks of AMA explained to pt and family, including, but not limited to:  Hydronephrosis, infected ureteral stone, stroke, heart attack, cardiac arrythmia ("irregular heart rate/beat"), "passing out," temporary and/or permanent disability, death.  Pt and family verb understanding and continue to refuse any further testing, understanding the consequences of their decision.  I encouraged pt to follow up with her PMD on Monday and return to the ED immediately if symptoms return, or for any other concerns.  Pt and family verb understanding, agreeable.             Laray Anger, DO 02/01/12 1154

## 2012-01-30 NOTE — ED Notes (Signed)
Pt c/o lower back and lower abd pain x 2 days. Pt states she is having dysuria.

## 2012-01-31 LAB — URINE CULTURE: Colony Count: 3000

## 2012-02-08 ENCOUNTER — Emergency Department (HOSPITAL_COMMUNITY): Payer: Self-pay

## 2012-02-08 ENCOUNTER — Encounter (HOSPITAL_COMMUNITY): Payer: Self-pay | Admitting: *Deleted

## 2012-02-08 ENCOUNTER — Emergency Department (HOSPITAL_COMMUNITY)
Admission: EM | Admit: 2012-02-08 | Discharge: 2012-02-08 | Disposition: A | Discharge status: Home / Self Care | Payer: Self-pay | Attending: Emergency Medicine | Admitting: Emergency Medicine

## 2012-02-08 DIAGNOSIS — K219 Gastro-esophageal reflux disease without esophagitis: Secondary | ICD-10-CM | POA: Insufficient documentation

## 2012-02-08 DIAGNOSIS — Z87440 Personal history of urinary (tract) infections: Secondary | ICD-10-CM | POA: Insufficient documentation

## 2012-02-08 DIAGNOSIS — Z79899 Other long term (current) drug therapy: Secondary | ICD-10-CM | POA: Insufficient documentation

## 2012-02-08 DIAGNOSIS — M5430 Sciatica, unspecified side: Secondary | ICD-10-CM | POA: Insufficient documentation

## 2012-02-08 DIAGNOSIS — M2550 Pain in unspecified joint: Secondary | ICD-10-CM | POA: Insufficient documentation

## 2012-02-08 DIAGNOSIS — Z8669 Personal history of other diseases of the nervous system and sense organs: Secondary | ICD-10-CM | POA: Insufficient documentation

## 2012-02-08 DIAGNOSIS — F411 Generalized anxiety disorder: Secondary | ICD-10-CM | POA: Insufficient documentation

## 2012-02-08 DIAGNOSIS — R269 Unspecified abnormalities of gait and mobility: Secondary | ICD-10-CM | POA: Insufficient documentation

## 2012-02-08 DIAGNOSIS — Z8541 Personal history of malignant neoplasm of cervix uteri: Secondary | ICD-10-CM | POA: Insufficient documentation

## 2012-02-08 DIAGNOSIS — F17200 Nicotine dependence, unspecified, uncomplicated: Secondary | ICD-10-CM | POA: Insufficient documentation

## 2012-02-08 DIAGNOSIS — Z87828 Personal history of other (healed) physical injury and trauma: Secondary | ICD-10-CM | POA: Insufficient documentation

## 2012-02-08 DIAGNOSIS — Z87442 Personal history of urinary calculi: Secondary | ICD-10-CM | POA: Insufficient documentation

## 2012-02-08 MED ORDER — PREDNISONE 50 MG PO TABS
60.0000 mg | ORAL_TABLET | Freq: Once | ORAL | Status: AC
  Administered 2012-02-08: 60 mg via ORAL
  Filled 2012-02-08: qty 1

## 2012-02-08 MED ORDER — DIAZEPAM 5 MG PO TABS
5.0000 mg | ORAL_TABLET | Freq: Once | ORAL | Status: AC
  Administered 2012-02-08: 5 mg via ORAL
  Filled 2012-02-08: qty 1

## 2012-02-08 MED ORDER — ONDANSETRON HCL 4 MG PO TABS
4.0000 mg | ORAL_TABLET | Freq: Once | ORAL | Status: AC
  Administered 2012-02-08: 4 mg via ORAL
  Filled 2012-02-08: qty 1

## 2012-02-08 MED ORDER — METHOCARBAMOL 500 MG PO TABS: ORAL_TABLET | ORAL | Status: DC

## 2012-02-08 MED ORDER — PREDNISONE 10 MG PO TABS: ORAL_TABLET | ORAL | Status: DC

## 2012-02-08 MED ORDER — HYDROCODONE-ACETAMINOPHEN 5-325 MG PO TABS
2.0000 | ORAL_TABLET | Freq: Once | ORAL | Status: AC
  Administered 2012-02-08: 2 via ORAL
  Filled 2012-02-08: qty 2

## 2012-02-08 MED ORDER — HYDROCODONE-ACETAMINOPHEN 7.5-325 MG PO TABS: 1.0000 | ORAL_TABLET | ORAL | Status: DC | PRN

## 2012-02-08 NOTE — Discharge Instructions (Signed)
Your CT^ scan reveals a bone spur at the L2 area, and a disc protrusion at the L5S1 area. Sciatica Sciatica is a condition often seen in patients with disk disease of the lower back. Pressure on the sciatica nerve causes pain to radiate from the lower back or buttock down the leg.  CAUSES  It results from pressure on nerve roots coming out of the spine. This is often the result of a disc that deteriorates and pushes to one side. Often there is a history of back problems.  TREATMENT  In most cases sciatica improves greatly with conservative treatment (treatment that does not involve surgery). Most patients are completely better after 2-4 weeks of bed rest and other supportive care. Bed rest reduces the disc pressure greatly. Sitting is the worst position. When sitting pressure on the disc is over 5 times greater than it is while lying down. Avoid:  Bending.   Lifting.   All other activities which make the problem worse.  After the pain improves, you may continue with normal activity. Take brief periods for bed rest throughout the day until you are back to normal. Only take over-the-counter or prescription medicines for pain, discomfort, or fever as directed by your caregiver. Muscle relaxants may help by relieving spasm and providing mild sedation. Cold or heat therapy and massage may also give significant relief. Spinal manipulation is not recommended because it can increase the degree of disc protrusion. Traction can be used in severe cases. Surgery is reserved for patients who:  Do not improve within the first months of conservative treatment.   Have signs of severe nerve root pressure.  See your doctor for follow up care as recommended. A program for back injury rehabilitation with stretching and strengthening exercises is an important part of healing.  SEEK MEDICAL CARE IF:   You notice increased pain.   You notice weakness.   You have numbness in your legs.   You have any difficulty  with bladder or bowel control.  Document Released: 04/23/2004 Document Revised: 03/05/2011 Document Reviewed: 03/16/2005 [facility] Patient Information 2012 [place], [place].Please use the crutches until you can safely apply weight on the right lower extremity. Please apply heat to your back. Use prednisone daily with food. Robaxin three times daily for spasm. Use norco for pain . Norco and Robaxin may cause drowsiness, use with caution. Please call Dr Sherlean Foot or the orthopedic MD of your choice for additional testing of your back and leg as discussed.

## 2012-02-08 NOTE — ED Provider Notes (Signed)
History     CSN: [id number]  Arrival date & time 02/08/12  1505   First MD Initiated Contact with Patient 02/08/12 1610      Chief Complaint  Patient presents with  . Leg Pain    (Consider location/radiation/quality/duration/timing/severity/associated sxs/prior treatment) Patient is a 26 y.o. female presenting with leg pain. The history is provided by the patient.  Leg Pain  The incident occurred more than 1 week ago. There was no injury mechanism. The pain is present in the right leg and right hip. The quality of the pain is described as sharp. The pain is severe. The pain has been intermittent since onset. Associated symptoms include inability to bear weight. She reports no foreign bodies present. The symptoms are aggravated by bearing weight. She has tried acetaminophen for the symptoms. The treatment provided no relief.    Past Medical History  Diagnosis Date  . Blood transfusion   . Pelvic fracture   . Wound of buttock   . Pain in right buttock   . Right humeral fracture   . Bell's palsy     26 yrs old  . UTI (urinary tract infection)   . GERD (gastroesophageal reflux disease)     occ  . Anxiety   . Cervical cancer 2010    cervical/ s/p total hysterectomy  . Kidney stone     Past Surgical History  Procedure Date  . Fracture surgery   . Hip surgery   . Abdominal hysterectomy 2010    cervical cancer  . Fracture surgery     pelvis, rt arm  . Ankle arthroplasty     plates not replacement  . Bony pelvis surgery     fx pelvis, fixation  . Humerus fracture surgery   . Sacro-iliac pinning 08/28/2011    Procedure: SACRO-ILIAC PINNING;  Surgeon: Budd Palmer, MD;  Location: [facility] OR;  Service: Orthopedics;  Laterality: Left;  REPAIR OF LEFT SACRUM NONUNION    History reviewed. No pertinent family history.  History  Substance Use Topics  . Smoking status: Current Some Day Smoker -- 0.5 packs/day for 12 years    Types: Cigarettes  . Smokeless tobacco: Not on file      Comment: teenager  . Alcohol Use: No    OB History    Grav Para Term Preterm Abortions TAB SAB Ect Mult Living                  Review of Systems  Constitutional: Negative for activity change.       All ROS Neg except as noted in HPI  HENT: Negative for nosebleeds and neck pain.   Eyes: Negative for photophobia and discharge.  Respiratory: Negative for cough, shortness of breath and wheezing.   Cardiovascular: Negative for chest pain and palpitations.  Gastrointestinal: Negative for abdominal pain and blood in stool.  Genitourinary: Negative for dysuria, frequency and hematuria.  Musculoskeletal: Positive for back pain, arthralgias and gait problem.  Skin: Negative.   Neurological: Negative for dizziness, seizures and speech difficulty.  Psychiatric/Behavioral: Negative for hallucinations and confusion.    Allergies  Aleve; Aspirin-acetaminophen-caffeine; Metronidazole; and Naproxen sodium  Home Medications   Current Outpatient Rx  Name  Route  Sig  Dispense  Refill  . ACETAMINOPHEN 500 MG PO TABS   Oral   Take 1,000 mg by mouth every 6 (six) hours as needed. Pain         . ALBUTEROL SULFATE HFA 108 (90 BASE) MCG/ACT IN AERS  Inhalation   Inhale 2 puffs into the lungs every 6 (six) hours as needed. Chest tightness         . ALPRAZOLAM 1 MG PO TABS   Oral   Take 1 mg by mouth 4 (four) times daily. scheduled         . CEPHALEXIN 500 MG PO CAPS   Oral   Take 1 capsule (500 mg total) by mouth 4 (four) times daily.   40 capsule   0   . IBUPROFEN 200 MG PO TABS   Oral   Take 800 mg by mouth daily as needed. For pain         . METHYLPHENIDATE HCL 10 MG PO TABS   Oral   Take 10 mg by mouth 3 (three) times daily.           BP 122/67  Pulse 104  Temp 98.6 F (37 C) (Oral)  Resp 20  Ht 5\' 4"  (1.626 m)  Wt 110 lb (49.896 kg)  BMI 18.88 kg/m2  SpO2 100%  Physical Exam  Nursing note and vitals reviewed. Constitutional: She is oriented to  person, place, and time. She appears well-developed and well-nourished.  Non-toxic appearance.  HENT:  Head: Normocephalic.  Right Ear: Tympanic membrane and external ear normal.  Left Ear: Tympanic membrane and external ear normal.  Eyes: EOM and lids are normal. Pupils are equal, round, and reactive to light.  Neck: Normal range of motion. Neck supple. Carotid bruit is not present.  Cardiovascular: Normal rate, regular rhythm, normal heart sounds, intact distal pulses and normal pulses.   Pulmonary/Chest: Breath sounds normal. No respiratory distress.  Abdominal: Soft. Bowel sounds are normal. There is no tenderness. There is no guarding.  Musculoskeletal: Normal range of motion.       And there is pain to patient of the lumbar area. There is pain from the back to the leg with raising of the right leg and rotation of the right hip.  Lymphadenopathy:       Head (right side): No submandibular adenopathy present.       Head (left side): No submandibular adenopathy present.    She has no cervical adenopathy.  Neurological: She is alert and oriented to person, place, and time. She has normal strength. No cranial nerve deficit or sensory deficit.       No gross sensory deficits appreciated. Patient will not completely cooperate with the motor exam do to pain.  Skin: Skin is warm and dry.  Psychiatric: Her speech is normal. Her mood appears anxious.    ED Course  Procedures (including critical care time)  Labs Reviewed - No data to display No results found.   No diagnosis found.    MDM  I have reviewed nursing notes, vital signs, and all appropriate lab and imaging results for this patient. Patient has pain of the lower back extending into the right hip and leg. She has a history of pelvic fracture in the past. She now states that she has severe pain with attempting to walk on the right hip and leg.  The patient was given oral value, prednisone, and Norco, and when I went back to  check on the patient patient was sleeping. 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 I have informed the patient of these findings and advised  her to see the orthopedic physicians for additional evaluation and management of this problem. Prescription for Robaxin 500 mg, prednisone taper, and Norco 7.5 mg #20 given to the patient.       Kathie Dike, [place] 02/08/12 1909

## 2012-02-08 NOTE — ED Notes (Signed)
Called Ct to see about delay and they has 3 pt's ahead of pt.
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
## 2012-02-09 NOTE — ED Provider Notes (Signed)
Medical screening examination/treatment/procedure(s) were performed by non-physician practitioner and as supervising physician I was immediately available for consultation/collaboration.   Bakari Nikolai, MD 02/09/12 1139 

## 2012-02-14 ENCOUNTER — Emergency Department (HOSPITAL_COMMUNITY)
Admission: EM | Admit: 2012-02-14 | Discharge: 2012-02-14 | Disposition: A | Discharge status: Home / Self Care | Payer: Self-pay | Attending: Emergency Medicine | Admitting: Emergency Medicine

## 2012-02-14 ENCOUNTER — Encounter (HOSPITAL_COMMUNITY): Payer: Self-pay

## 2012-02-14 DIAGNOSIS — Z8541 Personal history of malignant neoplasm of cervix uteri: Secondary | ICD-10-CM | POA: Insufficient documentation

## 2012-02-14 DIAGNOSIS — M549 Dorsalgia, unspecified: Secondary | ICD-10-CM

## 2012-02-14 DIAGNOSIS — F411 Generalized anxiety disorder: Secondary | ICD-10-CM | POA: Insufficient documentation

## 2012-02-14 DIAGNOSIS — F17200 Nicotine dependence, unspecified, uncomplicated: Secondary | ICD-10-CM | POA: Insufficient documentation

## 2012-02-14 DIAGNOSIS — Z9889 Other specified postprocedural states: Secondary | ICD-10-CM | POA: Insufficient documentation

## 2012-02-14 DIAGNOSIS — Z87442 Personal history of urinary calculi: Secondary | ICD-10-CM | POA: Insufficient documentation

## 2012-02-14 DIAGNOSIS — Z87440 Personal history of urinary (tract) infections: Secondary | ICD-10-CM | POA: Insufficient documentation

## 2012-02-14 DIAGNOSIS — M545 Low back pain, unspecified: Secondary | ICD-10-CM | POA: Insufficient documentation

## 2012-02-14 DIAGNOSIS — K219 Gastro-esophageal reflux disease without esophagitis: Secondary | ICD-10-CM | POA: Insufficient documentation

## 2012-02-14 MED ORDER — HYDROCODONE-ACETAMINOPHEN 5-325 MG PO TABS
2.0000 | ORAL_TABLET | Freq: Once | ORAL | Status: AC
  Administered 2012-02-14: 2 via ORAL
  Filled 2012-02-14: qty 2

## 2012-02-14 MED ORDER — METHOCARBAMOL 500 MG PO TABS: ORAL_TABLET | ORAL | Status: DC

## 2012-02-14 MED ORDER — OXYCODONE-ACETAMINOPHEN 5-325 MG PO TABS: 1.0000 | ORAL_TABLET | ORAL | Status: DC | PRN
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
## 2012-02-14 NOTE — ED Notes (Signed)
C/o lower back pain that radiates down right leg,

## 2012-02-17 NOTE — ED Provider Notes (Signed)
Medical screening examination/treatment/procedure(s) were performed by non-physician practitioner and as supervising physician I was immediately available for consultation/collaboration.  Donnetta Hutching, MD 02/17/12 [phone]

## 2012-02-22 ENCOUNTER — Encounter (HOSPITAL_COMMUNITY): Payer: Self-pay | Admitting: Emergency Medicine

## 2012-02-22 ENCOUNTER — Other Ambulatory Visit (HOSPITAL_COMMUNITY): Payer: Self-pay | Admitting: Orthopedic Surgery

## 2012-02-22 ENCOUNTER — Emergency Department (HOSPITAL_COMMUNITY)
Admission: EM | Admit: 2012-02-22 | Discharge: 2012-02-22 | Disposition: A | Discharge status: Home / Self Care | Payer: Self-pay | Attending: Emergency Medicine | Admitting: Emergency Medicine

## 2012-02-22 DIAGNOSIS — IMO0002 Reserved for concepts with insufficient information to code with codable children: Secondary | ICD-10-CM

## 2012-02-22 DIAGNOSIS — F17200 Nicotine dependence, unspecified, uncomplicated: Secondary | ICD-10-CM | POA: Insufficient documentation

## 2012-02-22 DIAGNOSIS — Z87442 Personal history of urinary calculi: Secondary | ICD-10-CM | POA: Insufficient documentation

## 2012-02-22 DIAGNOSIS — Z8719 Personal history of other diseases of the digestive system: Secondary | ICD-10-CM | POA: Insufficient documentation

## 2012-02-22 DIAGNOSIS — Z79899 Other long term (current) drug therapy: Secondary | ICD-10-CM | POA: Insufficient documentation

## 2012-02-22 DIAGNOSIS — Z8589 Personal history of malignant neoplasm of other organs and systems: Secondary | ICD-10-CM | POA: Insufficient documentation

## 2012-02-22 DIAGNOSIS — M545 Low back pain, unspecified: Secondary | ICD-10-CM | POA: Insufficient documentation

## 2012-02-22 DIAGNOSIS — M79609 Pain in unspecified limb: Secondary | ICD-10-CM | POA: Insufficient documentation

## 2012-02-22 DIAGNOSIS — F411 Generalized anxiety disorder: Secondary | ICD-10-CM | POA: Insufficient documentation

## 2012-02-22 DIAGNOSIS — M5416 Radiculopathy, lumbar region: Secondary | ICD-10-CM

## 2012-02-22 DIAGNOSIS — M5126 Other intervertebral disc displacement, lumbar region: Secondary | ICD-10-CM | POA: Insufficient documentation

## 2012-02-22 DIAGNOSIS — Z87440 Personal history of urinary (tract) infections: Secondary | ICD-10-CM | POA: Insufficient documentation

## 2012-02-22 DIAGNOSIS — Z9889 Other specified postprocedural states: Secondary | ICD-10-CM | POA: Insufficient documentation

## 2012-02-22 DIAGNOSIS — Z87828 Personal history of other (healed) physical injury and trauma: Secondary | ICD-10-CM | POA: Insufficient documentation

## 2012-02-22 DIAGNOSIS — G8921 Chronic pain due to trauma: Secondary | ICD-10-CM | POA: Insufficient documentation

## 2012-02-22 MED ORDER — METHOCARBAMOL 500 MG PO TABS: 1000.0000 mg | ORAL_TABLET | Freq: Four times a day (QID) | ORAL | Status: AC

## 2012-02-22 MED ORDER — HYDROCODONE-ACETAMINOPHEN 5-325 MG PO TABS: 1.0000 | ORAL_TABLET | ORAL | Status: AC | PRN

## 2012-02-22 MED ORDER — METHOCARBAMOL 500 MG PO TABS
1000.0000 mg | ORAL_TABLET | Freq: Once | ORAL | Status: AC
  Administered 2012-02-22: 1000 mg via ORAL
  Filled 2012-02-22: qty 2

## 2012-02-22 MED ORDER — HYDROCODONE-ACETAMINOPHEN 5-325 MG PO TABS: 1.0000 | ORAL_TABLET | ORAL | Status: DC | PRN

## 2012-02-22 MED ORDER — HYDROCODONE-ACETAMINOPHEN 5-325 MG PO TABS
2.0000 | ORAL_TABLET | Freq: Once | ORAL | Status: AC
  Administered 2012-02-22: 2 via ORAL
  Filled 2012-02-22: qty 2

## 2012-02-22 MED ORDER — METHOCARBAMOL 500 MG PO TABS: 1000.0000 mg | ORAL_TABLET | Freq: Four times a day (QID) | ORAL | Status: DC
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
## 2012-02-22 NOTE — ED Notes (Signed)
Low back pain  For 2 weeks, worse today. No known recent injury.
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
## 2012-02-22 NOTE — ED Provider Notes (Signed)
Medical screening examination/treatment/procedure(s) were performed by non-physician practitioner and as supervising physician I was immediately available for consultation/collaboration.  Jones Skene, M.D.     Jones Skene, MD 02/22/12 1654

## 2012-02-26 ENCOUNTER — Other Ambulatory Visit: Payer: Self-pay | Admitting: Orthopedic Surgery

## 2012-02-26 DIAGNOSIS — M533 Sacrococcygeal disorders, not elsewhere classified: Secondary | ICD-10-CM

## 2012-03-01 ENCOUNTER — Inpatient Hospital Stay (HOSPITAL_COMMUNITY): Admission: RE | Admit: 2012-03-01 | Payer: Self-pay | Source: Ambulatory Visit

## 2012-03-03 ENCOUNTER — Ambulatory Visit
Admission: RE | Admit: 2012-03-03 | Discharge: 2012-03-03 | Disposition: A | Discharge status: Home / Self Care | Payer: Self-pay | Source: Ambulatory Visit | Attending: Orthopedic Surgery | Admitting: Orthopedic Surgery

## 2012-03-03 ENCOUNTER — Other Ambulatory Visit: Payer: Self-pay

## 2012-03-03 DIAGNOSIS — M533 Sacrococcygeal disorders, not elsewhere classified: Secondary | ICD-10-CM

## 2012-03-03 MED ORDER — IOHEXOL 180 MG/ML  SOLN
1.0000 mL | Freq: Once | INTRAMUSCULAR | Status: AC | PRN
  Administered 2012-03-03: 1 mL via INTRA_ARTICULAR

## 2012-03-07 ENCOUNTER — Encounter (HOSPITAL_COMMUNITY): Payer: Self-pay | Admitting: *Deleted

## 2012-03-07 DIAGNOSIS — Z8719 Personal history of other diseases of the digestive system: Secondary | ICD-10-CM | POA: Insufficient documentation

## 2012-03-07 DIAGNOSIS — Z8541 Personal history of malignant neoplasm of cervix uteri: Secondary | ICD-10-CM | POA: Insufficient documentation

## 2012-03-07 DIAGNOSIS — F411 Generalized anxiety disorder: Secondary | ICD-10-CM | POA: Insufficient documentation

## 2012-03-07 DIAGNOSIS — F17200 Nicotine dependence, unspecified, uncomplicated: Secondary | ICD-10-CM | POA: Insufficient documentation

## 2012-03-07 DIAGNOSIS — Z79899 Other long term (current) drug therapy: Secondary | ICD-10-CM | POA: Insufficient documentation

## 2012-03-07 DIAGNOSIS — M545 Low back pain, unspecified: Secondary | ICD-10-CM | POA: Insufficient documentation

## 2012-03-07 DIAGNOSIS — Z87442 Personal history of urinary calculi: Secondary | ICD-10-CM | POA: Insufficient documentation

## 2012-03-07 DIAGNOSIS — Z9889 Other specified postprocedural states: Secondary | ICD-10-CM | POA: Insufficient documentation

## 2012-03-07 DIAGNOSIS — M79609 Pain in unspecified limb: Secondary | ICD-10-CM | POA: Insufficient documentation

## 2012-03-07 DIAGNOSIS — Z87440 Personal history of urinary (tract) infections: Secondary | ICD-10-CM | POA: Insufficient documentation

## 2012-03-07 DIAGNOSIS — Z8669 Personal history of other diseases of the nervous system and sense organs: Secondary | ICD-10-CM | POA: Insufficient documentation

## 2012-03-07 NOTE — ED Notes (Signed)
Pt reporting pain in lower back and legs intermittently for several weeks.  Reports L2 and L5 bulging disks.  Reports increased pain tonight. Reports receiving cortisone injections on 12/5 with minimal relief.

## 2012-03-07 NOTE — ED Notes (Signed)
Attempted to place pt in room.  No response when called.  Per security, pt walked out to parking area, very angry that she wasn't placed in room immediately.

## 2012-03-08 ENCOUNTER — Emergency Department (HOSPITAL_COMMUNITY)
Admission: EM | Admit: 2012-03-08 | Discharge: 2012-03-08 | Discharge status: Against Medical Advice | Payer: Self-pay | Attending: Emergency Medicine | Admitting: Emergency Medicine

## 2012-03-08 NOTE — ED Notes (Signed)
Second attempt to place pt in room.  Pt not in waiting area.

## 2012-03-08 NOTE — ED Notes (Signed)
Third attempt to place pt in room.  Not in waiting area. Per registration, pt left department.

## 2012-03-11 ENCOUNTER — Encounter (HOSPITAL_COMMUNITY): Payer: Self-pay | Admitting: *Deleted

## 2012-03-11 ENCOUNTER — Emergency Department (HOSPITAL_COMMUNITY)
Admission: EM | Admit: 2012-03-11 | Discharge: 2012-03-11 | Disposition: A | Discharge status: Home / Self Care | Payer: Self-pay | Attending: Emergency Medicine | Admitting: Emergency Medicine

## 2012-03-11 DIAGNOSIS — F17200 Nicotine dependence, unspecified, uncomplicated: Secondary | ICD-10-CM | POA: Insufficient documentation

## 2012-03-11 DIAGNOSIS — Z7901 Long term (current) use of anticoagulants: Secondary | ICD-10-CM | POA: Insufficient documentation

## 2012-03-11 DIAGNOSIS — M549 Dorsalgia, unspecified: Secondary | ICD-10-CM

## 2012-03-11 DIAGNOSIS — M545 Low back pain, unspecified: Secondary | ICD-10-CM | POA: Insufficient documentation

## 2012-03-11 DIAGNOSIS — Z8781 Personal history of (healed) traumatic fracture: Secondary | ICD-10-CM | POA: Insufficient documentation

## 2012-03-11 DIAGNOSIS — Z8669 Personal history of other diseases of the nervous system and sense organs: Secondary | ICD-10-CM | POA: Insufficient documentation

## 2012-03-11 DIAGNOSIS — K219 Gastro-esophageal reflux disease without esophagitis: Secondary | ICD-10-CM | POA: Insufficient documentation

## 2012-03-11 DIAGNOSIS — Z8541 Personal history of malignant neoplasm of cervix uteri: Secondary | ICD-10-CM | POA: Insufficient documentation

## 2012-03-11 DIAGNOSIS — Z79899 Other long term (current) drug therapy: Secondary | ICD-10-CM | POA: Insufficient documentation

## 2012-03-11 DIAGNOSIS — F411 Generalized anxiety disorder: Secondary | ICD-10-CM | POA: Insufficient documentation

## 2012-03-11 DIAGNOSIS — Z87440 Personal history of urinary (tract) infections: Secondary | ICD-10-CM | POA: Insufficient documentation

## 2012-03-11 DIAGNOSIS — Z9889 Other specified postprocedural states: Secondary | ICD-10-CM | POA: Insufficient documentation

## 2012-03-11 DIAGNOSIS — Z87442 Personal history of urinary calculi: Secondary | ICD-10-CM | POA: Insufficient documentation

## 2012-03-11 MED ORDER — METHOCARBAMOL 500 MG PO TABS
1000.0000 mg | ORAL_TABLET | Freq: Once | ORAL | Status: AC
  Administered 2012-03-11: 1000 mg via ORAL
  Filled 2012-03-11: qty 2

## 2012-03-11 MED ORDER — METHOCARBAMOL 500 MG PO TABS: 500.0000 mg | ORAL_TABLET | Freq: Three times a day (TID) | ORAL | Status: DC

## 2012-03-11 MED ORDER — HYDROCODONE-ACETAMINOPHEN 7.5-325 MG PO TABS: 1.0000 | ORAL_TABLET | ORAL | Status: AC | PRN

## 2012-03-11 MED ORDER — PREDNISONE 10 MG PO TABS: ORAL_TABLET | ORAL | Status: DC

## 2012-03-11 MED ORDER — PREDNISONE 50 MG PO TABS
60.0000 mg | ORAL_TABLET | Freq: Once | ORAL | Status: AC
  Administered 2012-03-11: 60 mg via ORAL
  Filled 2012-03-11: qty 1

## 2012-03-11 MED ORDER — MORPHINE SULFATE 4 MG/ML IJ SOLN
8.0000 mg | Freq: Once | INTRAMUSCULAR | Status: AC
  Administered 2012-03-11: 8 mg via INTRAMUSCULAR
  Filled 2012-03-11: qty 2

## 2012-03-11 MED ORDER — PROMETHAZINE HCL 12.5 MG PO TABS
25.0000 mg | ORAL_TABLET | Freq: Once | ORAL | Status: AC
  Administered 2012-03-11: 25 mg via ORAL
  Filled 2012-03-11: qty 2

## 2012-03-11 NOTE — ED Notes (Signed)
Discharge instructions reviewed with pt, questions answered. Pt verbalized understanding.  

## 2012-03-11 NOTE — ED Notes (Signed)
Pelvic  And low back pain.  Had an injection for this 1 week ago but no better.

## 2012-03-12 NOTE — ED Provider Notes (Signed)
History     CSN: [id number]  Arrival date & time 03/11/12  2595   First MD Initiated Contact with Patient 03/11/12 2006      Chief Complaint  Patient presents with  . Pelvic Pain    (Consider location/radiation/quality/duration/timing/severity/associated sxs/prior treatment) Patient is a 26 y.o. female presenting with back pain. The history is provided by the patient.  Back Pain  This is a chronic problem. The problem occurs daily. The problem has been gradually worsening. Associated with: pelvic fracture in the past. The pain is present in the lumbar spine. The quality of the pain is described as aching. Radiates to: to pelvis. The pain is moderate. The symptoms are aggravated by certain positions. The pain is the same all the time. Pertinent negatives include no chest pain, no fever, no numbness, no abdominal pain, no bowel incontinence, no perianal numbness, no bladder incontinence and no dysuria. She has tried NSAIDs for the symptoms. The treatment provided no relief.    Past Medical History  Diagnosis Date  . Blood transfusion   . Pelvic fracture   . Wound of buttock   . Pain in right buttock   . Right humeral fracture   . Bell's palsy     26 yrs old  . UTI (urinary tract infection)   . GERD (gastroesophageal reflux disease)     occ  . Anxiety   . Cervical cancer 2010    cervical/ s/p total hysterectomy  . Kidney stone     Past Surgical History  Procedure Date  . Hip surgery   . Abdominal hysterectomy 2010    cervical cancer  . Ankle arthroplasty     plates not replacement  . Bony pelvis surgery     fx pelvis, fixation  . Humerus fracture surgery   . Sacro-iliac pinning 08/28/2011    Procedure: SACRO-ILIAC PINNING;  Surgeon: Budd Palmer, MD;  Location: [facility] OR;  Service: Orthopedics;  Laterality: Left;  REPAIR OF LEFT SACRUM NONUNION  . Fracture surgery   . Fracture surgery     pelvis, rt arm    History reviewed. No pertinent family history.  History   Substance Use Topics  . Smoking status: Current Some Day Smoker -- 0.5 packs/day for 12 years    Types: Cigarettes  . Smokeless tobacco: Not on file     Comment: teenager  . Alcohol Use: No    OB History    Grav Para Term Preterm Abortions TAB SAB Ect Mult Living                  Review of Systems  Constitutional: Negative for fever and activity change.       All ROS Neg except as noted in HPI  HENT: Negative for nosebleeds and neck pain.   Eyes: Negative for photophobia and discharge.  Respiratory: Negative for cough, shortness of breath and wheezing.   Cardiovascular: Negative for chest pain and palpitations.  Gastrointestinal: Negative for abdominal pain, blood in stool and bowel incontinence.  Genitourinary: Negative for bladder incontinence, dysuria, frequency and hematuria.  Musculoskeletal: Positive for back pain. Negative for arthralgias.  Skin: Negative.   Neurological: Negative for dizziness, seizures, speech difficulty and numbness.  Psychiatric/Behavioral: Negative for hallucinations and confusion. The patient is nervous/anxious.     Allergies  Aleve; Aspirin-acetaminophen-caffeine; Metronidazole; and Naproxen sodium  Home Medications   Current Outpatient Rx  Name  Route  Sig  Dispense  Refill  . ALBUTEROL SULFATE HFA 108 (90 BASE) MCG/ACT  IN AERS   Inhalation   Inhale 2 puffs into the lungs every 6 (six) hours as needed. Chest tightness         . ALPRAZOLAM 1 MG PO TABS   Oral   Take 1 mg by mouth 4 (four) times daily.          Marland Kitchen VITAMIN C PO   Oral   Take 1 tablet by mouth daily.         . IBUPROFEN 200 MG PO TABS   Oral   Take 1,200 mg by mouth 3 (three) times daily. For pain         . METHYLPHENIDATE HCL 10 MG PO TABS   Oral   Take 10 mg by mouth 3 (three) times daily.         Marland Kitchen VITAMIN E PO   Oral   Take 1 tablet by mouth daily.         Marland Kitchen HYDROCODONE-ACETAMINOPHEN 7.5-325 MG PO TABS   Oral   Take 1 tablet by mouth every 4  (four) hours as needed for pain.   20 tablet   0   . METHOCARBAMOL 500 MG PO TABS   Oral   Take 1 tablet (500 mg total) by mouth 3 (three) times daily.   21 tablet   0   . PREDNISONE 10 MG PO TABS      6,5,4,3,2,1 - take with food   21 tablet   0     BP 113/65  Pulse 94  Temp 97.9 F (36.6 C) (Oral)  Resp 18  Ht 5\' 4"  (1.626 m)  Wt 114 lb (51.71 kg)  BMI 19.57 kg/m2  SpO2 97%  Physical Exam  Nursing note and vitals reviewed. Constitutional: She is oriented to person, place, and time. She appears well-developed and well-nourished.  Non-toxic appearance.  HENT:  Head: Normocephalic.  Right Ear: Tympanic membrane and external ear normal.  Left Ear: Tympanic membrane and external ear normal.  Eyes: EOM and lids are normal. Pupils are equal, round, and reactive to light.  Neck: Normal range of motion. Neck supple. Carotid bruit is not present.  Cardiovascular: Normal rate, regular rhythm, normal heart sounds, intact distal pulses and normal pulses.   Pulmonary/Chest: Breath sounds normal. No respiratory distress.  Abdominal: Soft. Bowel sounds are normal. There is no tenderness. There is no guarding.  Musculoskeletal: Normal range of motion.       Low back pain and pelvis pain with change of positions and walking. No hot areas. Pain to palpation of the lower lumbar and sacral area.  Lymphadenopathy:       Head (right side): No submandibular adenopathy present.       Head (left side): No submandibular adenopathy present.    She has no cervical adenopathy.  Neurological: She is alert and oriented to person, place, and time. She has normal strength. No cranial nerve deficit or sensory deficit.  Skin: Skin is warm and dry.  Psychiatric: She has a normal mood and affect. Her speech is normal.    ED Course  Procedures (including critical care time)  Labs Reviewed - No data to display No results found.   1. Back pain       MDM  I have reviewed nursing notes, vital  signs, and all appropriate lab and imaging results for this patient. Patient has had problems with back pain and pelvic pain since her pelvic fracture. She has been treated on multiple occasions for this pain. No  new falls or injuries or procedures are reported.the patient is treated with Robaxin 500 mg, prednisone taper, and Norco 7.5 mg #20 tablets. Patient strongly advised to see her primary physician or specialist for management of this ongoing problem.       Kathie Dike, [place] 03/12/12 [phone]

## 2012-03-12 NOTE — ED Provider Notes (Signed)
Medical screening examination/treatment/procedure(s) were performed by non-physician practitioner and as supervising physician I was immediately available for consultation/collaboration.   Shelda Jakes, MD 03/12/12 [phone]

## 2012-04-27 ENCOUNTER — Emergency Department (HOSPITAL_COMMUNITY)
Admission: EM | Admit: 2012-04-27 | Discharge: 2012-04-27 | Disposition: A | Discharge status: Home / Self Care | Payer: Self-pay | Attending: Emergency Medicine | Admitting: Emergency Medicine

## 2012-04-27 ENCOUNTER — Encounter (HOSPITAL_COMMUNITY): Payer: Self-pay | Admitting: Emergency Medicine

## 2012-04-27 DIAGNOSIS — Z8719 Personal history of other diseases of the digestive system: Secondary | ICD-10-CM | POA: Insufficient documentation

## 2012-04-27 DIAGNOSIS — Z79899 Other long term (current) drug therapy: Secondary | ICD-10-CM | POA: Insufficient documentation

## 2012-04-27 DIAGNOSIS — F17200 Nicotine dependence, unspecified, uncomplicated: Secondary | ICD-10-CM | POA: Insufficient documentation

## 2012-04-27 DIAGNOSIS — Z8781 Personal history of (healed) traumatic fracture: Secondary | ICD-10-CM | POA: Insufficient documentation

## 2012-04-27 DIAGNOSIS — K029 Dental caries, unspecified: Secondary | ICD-10-CM | POA: Insufficient documentation

## 2012-04-27 DIAGNOSIS — Z8541 Personal history of malignant neoplasm of cervix uteri: Secondary | ICD-10-CM | POA: Insufficient documentation

## 2012-04-27 DIAGNOSIS — F411 Generalized anxiety disorder: Secondary | ICD-10-CM | POA: Insufficient documentation

## 2012-04-27 DIAGNOSIS — Z87442 Personal history of urinary calculi: Secondary | ICD-10-CM | POA: Insufficient documentation

## 2012-04-27 DIAGNOSIS — Z8669 Personal history of other diseases of the nervous system and sense organs: Secondary | ICD-10-CM | POA: Insufficient documentation

## 2012-04-27 DIAGNOSIS — Z87440 Personal history of urinary (tract) infections: Secondary | ICD-10-CM | POA: Insufficient documentation

## 2012-04-27 DIAGNOSIS — K047 Periapical abscess without sinus: Secondary | ICD-10-CM | POA: Insufficient documentation

## 2012-04-27 DIAGNOSIS — Z872 Personal history of diseases of the skin and subcutaneous tissue: Secondary | ICD-10-CM | POA: Insufficient documentation

## 2012-04-27 MED ORDER — TRAMADOL HCL 50 MG PO TABS: 50.0000 mg | ORAL_TABLET | Freq: Four times a day (QID) | ORAL | Status: DC | PRN

## 2012-04-27 MED ORDER — AMOXICILLIN 500 MG PO CAPS: 500.0000 mg | ORAL_CAPSULE | Freq: Three times a day (TID) | ORAL | Status: DC

## 2012-04-27 MED ORDER — TRAMADOL HCL 50 MG PO TABS
50.0000 mg | ORAL_TABLET | Freq: Once | ORAL | Status: DC
  Filled 2012-04-27: qty 1

## 2012-04-27 MED ORDER — AMOXICILLIN 250 MG/5ML PO SUSR
500.0000 mg | Freq: Once | ORAL | Status: AC
  Administered 2012-04-27: 500 mg via ORAL
  Filled 2012-04-27: qty 10

## 2012-04-27 MED ORDER — AMOXICILLIN 250 MG/5ML PO SUSR: 500.0000 mg | Freq: Three times a day (TID) | ORAL | Status: DC

## 2012-04-27 MED ORDER — AMOXICILLIN 250 MG PO CAPS
500.0000 mg | ORAL_CAPSULE | Freq: Once | ORAL | Status: DC
  Filled 2012-04-27: qty 2

## 2012-04-27 NOTE — ED Provider Notes (Signed)
History     CSN: [id number]  Arrival date & time 04/27/12  1036   First MD Initiated Contact with Patient 04/27/12 1056      Chief Complaint  Patient presents with  . Dental Pain    (Consider location/radiation/quality/duration/timing/severity/associated sxs/prior treatment) HPI Comments: [name]  presents with a 1 day history of dental pain and gingival swelling.   The patient has a history of decay in the teeth involved which has recently started to cause pain.  She has not been able to see a dentist about this problem.  She woke today with worsened pain.  There has been no fevers,  Chills, nausea or vomiting, also no complaint of difficulty swallowing,  Although chewing makes pain worse.  The patient has tried no medications prior to arrival.    The history is provided by the patient.    Past Medical History  Diagnosis Date  . Blood transfusion   . Pelvic fracture   . Wound of buttock   . Pain in right buttock   . Right humeral fracture   . Bell's palsy     27 yrs old  . UTI (urinary tract infection)   . GERD (gastroesophageal reflux disease)     occ  . Anxiety   . Cervical cancer 2010    cervical/ s/p total hysterectomy  . Kidney stone     Past Surgical History  Procedure Date  . Hip surgery   . Abdominal hysterectomy 2010    cervical cancer  . Ankle arthroplasty     plates not replacement  . Bony pelvis surgery     fx pelvis, fixation  . Humerus fracture surgery   . Sacro-iliac pinning 08/28/2011    Procedure: SACRO-ILIAC PINNING;  Surgeon: Budd Palmer, MD;  Location: [facility] OR;  Service: Orthopedics;  Laterality: Left;  REPAIR OF LEFT SACRUM NONUNION  . Fracture surgery   . Fracture surgery     pelvis, rt arm    History reviewed. No pertinent family history.  History  Substance Use Topics  . Smoking status: Current Some Day Smoker -- 0.5 packs/day for 12 years    Types: Cigarettes  . Smokeless tobacco: Not on file     Comment: teenager  .  Alcohol Use: No    OB History    Grav Para Term Preterm Abortions TAB SAB Ect Mult Living                  Review of Systems  Constitutional: Negative for fever.  HENT: Positive for dental problem. Negative for sore throat, facial swelling, neck pain and neck stiffness.   Respiratory: Negative for shortness of breath.     Allergies  Aleve; Aspirin-acetaminophen-caffeine; Metronidazole; and Naproxen sodium  Home Medications   Current Outpatient Rx  Name  Route  Sig  Dispense  Refill  . ALBUTEROL SULFATE HFA 108 (90 BASE) MCG/ACT IN AERS   Inhalation   Inhale 2 puffs into the lungs every 6 (six) hours as needed. Chest tightness         . ALPRAZOLAM 1 MG PO TABS   Oral   Take 1 mg by mouth 4 (four) times daily.          . IBUPROFEN 200 MG PO TABS   Oral   Take 1,200 mg by mouth 3 (three) times daily. For pain         . METHYLPHENIDATE HCL 10 MG PO TABS   Oral   Take 10  mg by mouth 3 (three) times daily.         . AMOXICILLIN 500 MG PO CAPS   Oral   Take 1 capsule (500 mg total) by mouth 3 (three) times daily.   30 capsule   0   . TRAMADOL HCL 50 MG PO TABS   Oral   Take 1 tablet (50 mg total) by mouth every 6 (six) hours as needed for pain.   15 tablet   0     BP 115/76  Pulse 127  Temp 99.3 F (37.4 C) (Oral)  Resp 20  Ht 5\' 5"  (1.651 m)  Wt 120 lb (54.432 kg)  BMI 19.97 kg/m2  SpO2 99%  Physical Exam  Constitutional: She is oriented to person, place, and time. She appears well-developed and well-nourished. No distress.  HENT:  Head: Normocephalic and atraumatic. No trismus in the jaw.  Right Ear: Tympanic membrane and external ear normal.  Left Ear: Tympanic membrane and external ear normal.  Mouth/Throat: Oropharynx is clear and moist and mucous membranes are normal. No oral lesions. Dental abscesses present.         Old chronic appearing decay and occlusive surfaces of left first and second lower molars.  Mild surrounding gingival  erythema without edema or fluctuance.  Eyes: Conjunctivae normal are normal.  Neck: Normal range of motion. Neck supple.  Cardiovascular: Normal rate and normal heart sounds.   Pulmonary/Chest: Effort normal.  Abdominal: She exhibits no distension.  Musculoskeletal: Normal range of motion.  Lymphadenopathy:    She has no cervical adenopathy.  Neurological: She is alert and oriented to person, place, and time.  Skin: Skin is warm and dry. No erythema.  Psychiatric: She has a normal mood and affect.    ED Course  Procedures (including critical care time)  Labs Reviewed - No data to display No results found.   1. Dental abscess       MDM  Chronic dental pain in a patient with no systemic symptoms.  Prescribed tramadol and amoxicillin.  Dental referrals given.        Burgess Amor, [place] 04/27/12 1119

## 2012-04-27 NOTE — ED Notes (Signed)
Pt became very agitated upon med administration. Pt took both syringes of amoxil. Pt threw syringes after taking them and walked out of room. Pt boyfriend signed for pt. Only one syringe found in trash can. Other syringe missing. PA informed. Room searched, other syringe not in room. Pa aware and no new orders given.

## 2012-04-27 NOTE — ED Notes (Signed)
Pt woke up with lower left dental pain. Pt c/o pain/swelling. No obvious swelling noted. Nad.

## 2012-04-28 NOTE — ED Provider Notes (Signed)
Medical screening examination/treatment/procedure(s) were performed by non-physician practitioner and as supervising physician I was immediately available for consultation/collaboration.   Jade Burkard, MD 04/28/12 0709 

## 2012-08-03 IMAGING — CR DG HAND COMPLETE 3+V*R*
4 series · 4 of 4 positions shown · non-contrast
Comparison: None.

CLINICAL DATA: Pain.

RIGHT HAND - COMPLETE 3+ VIEW

[view not recorded (1 of 4)]
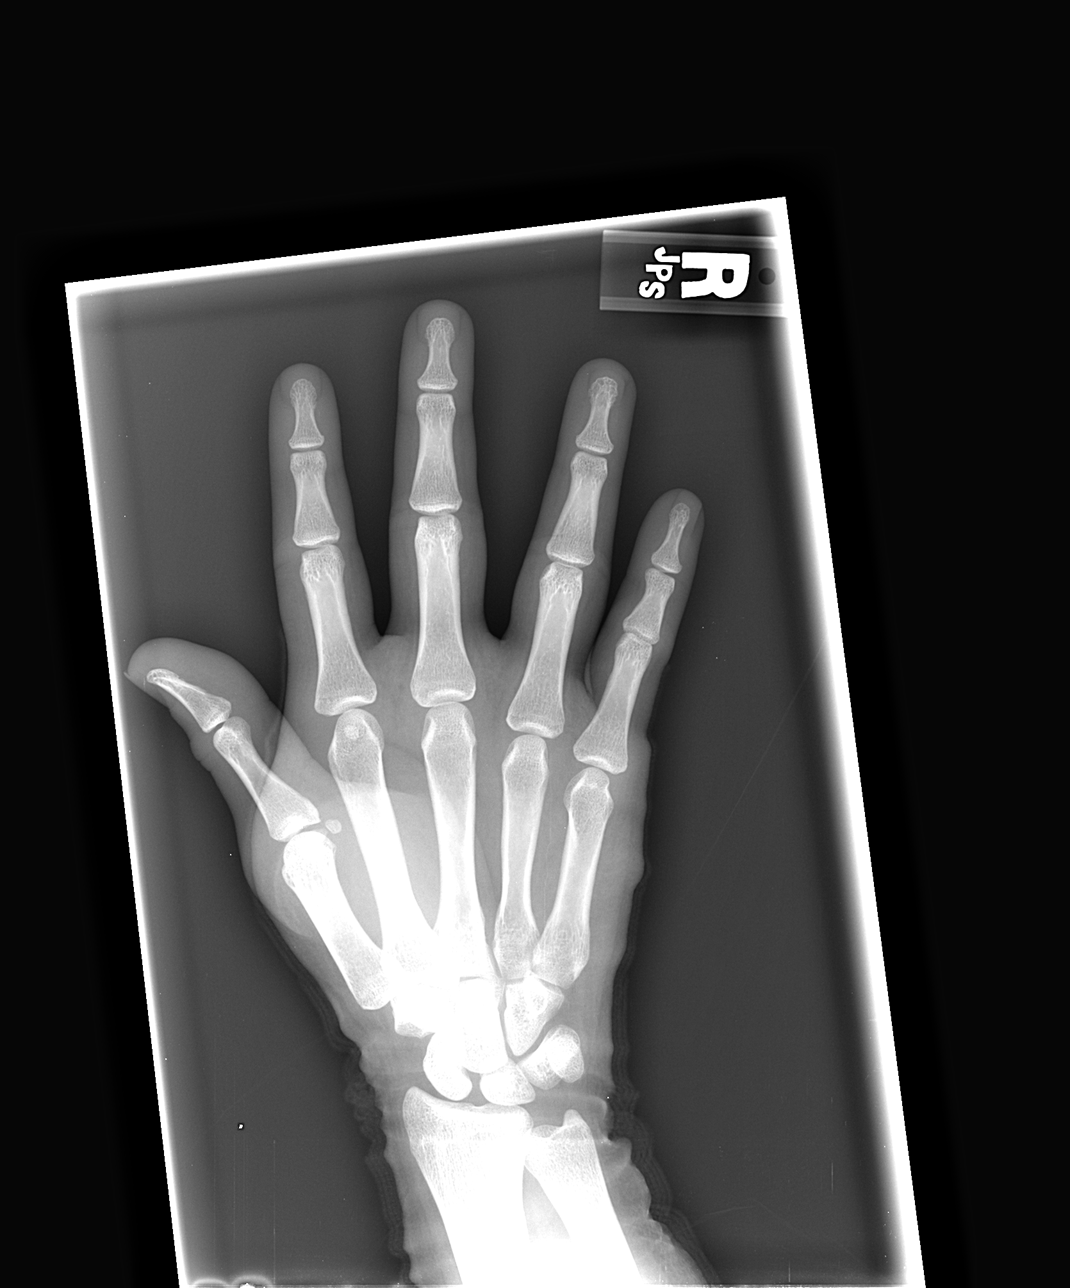

[view not recorded (2 of 4)]
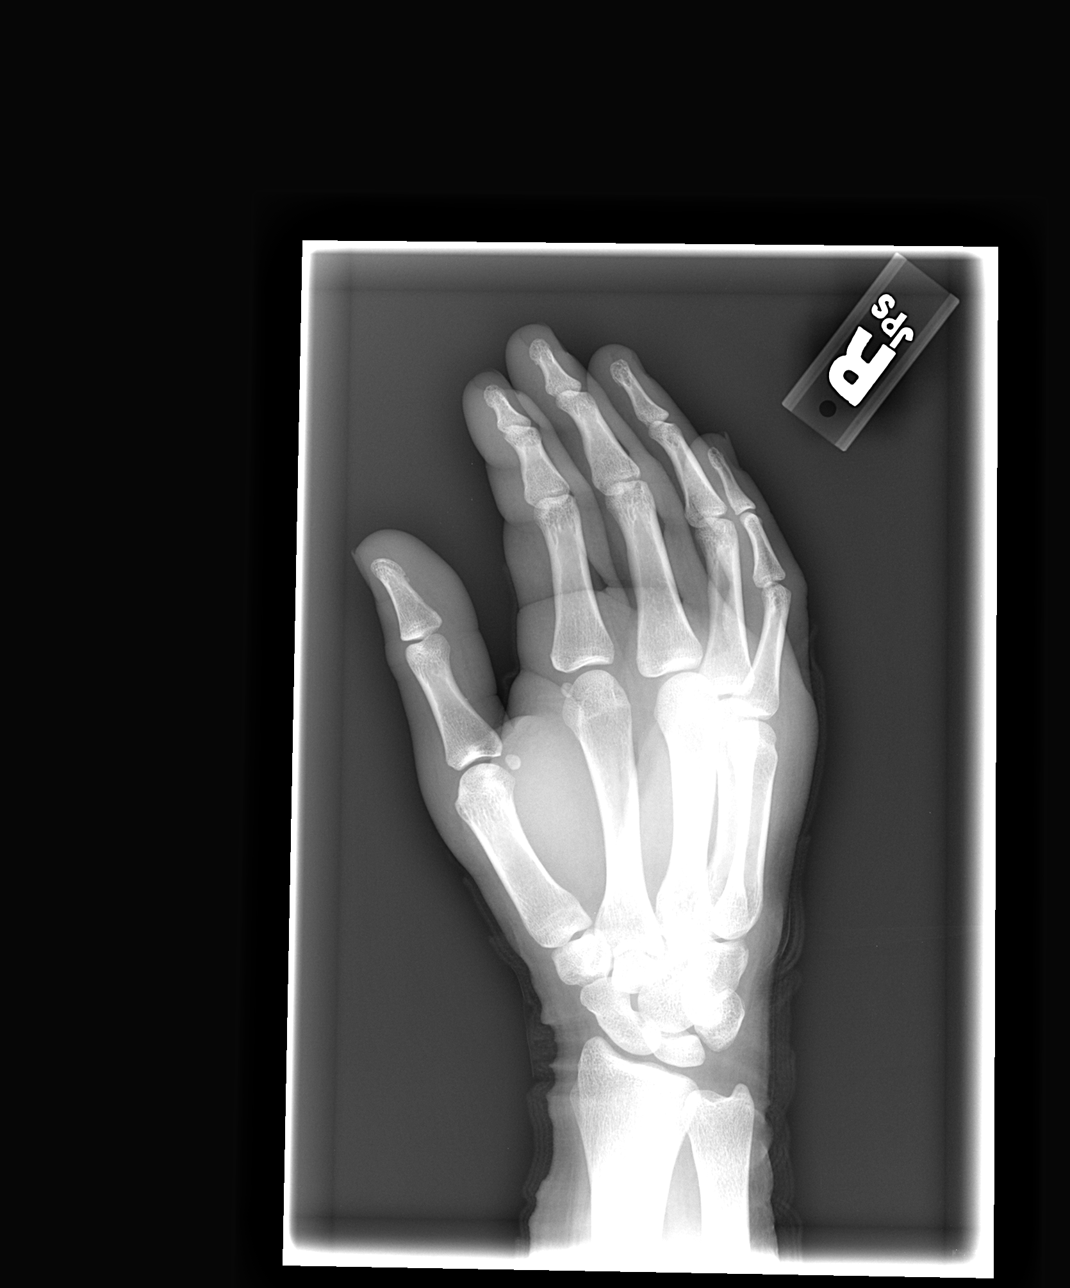

[view not recorded (3 of 4)]
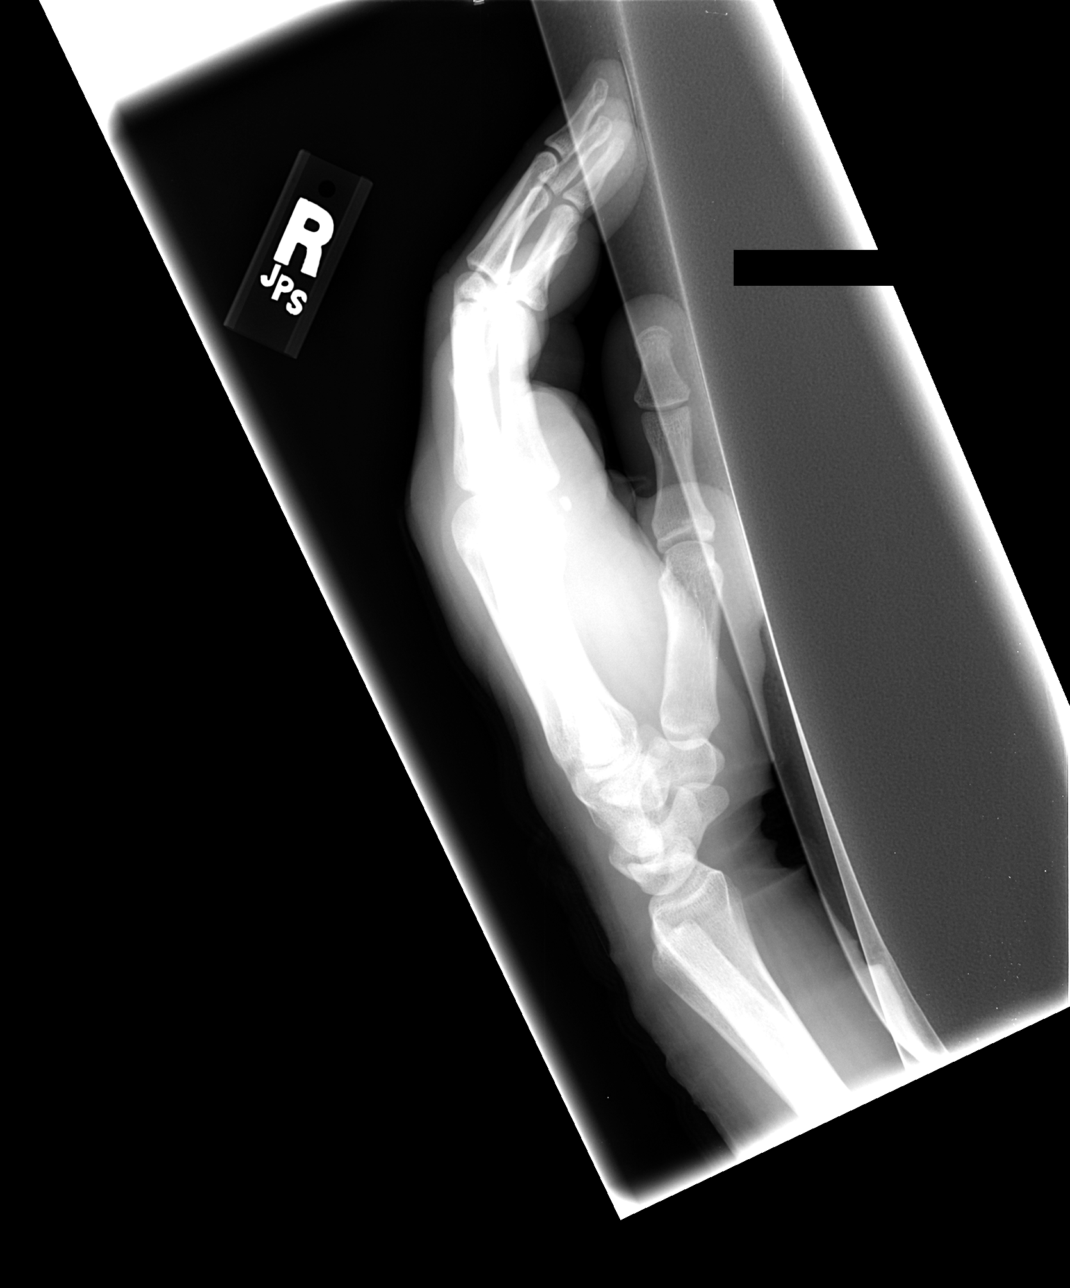

[view not recorded (4 of 4)]
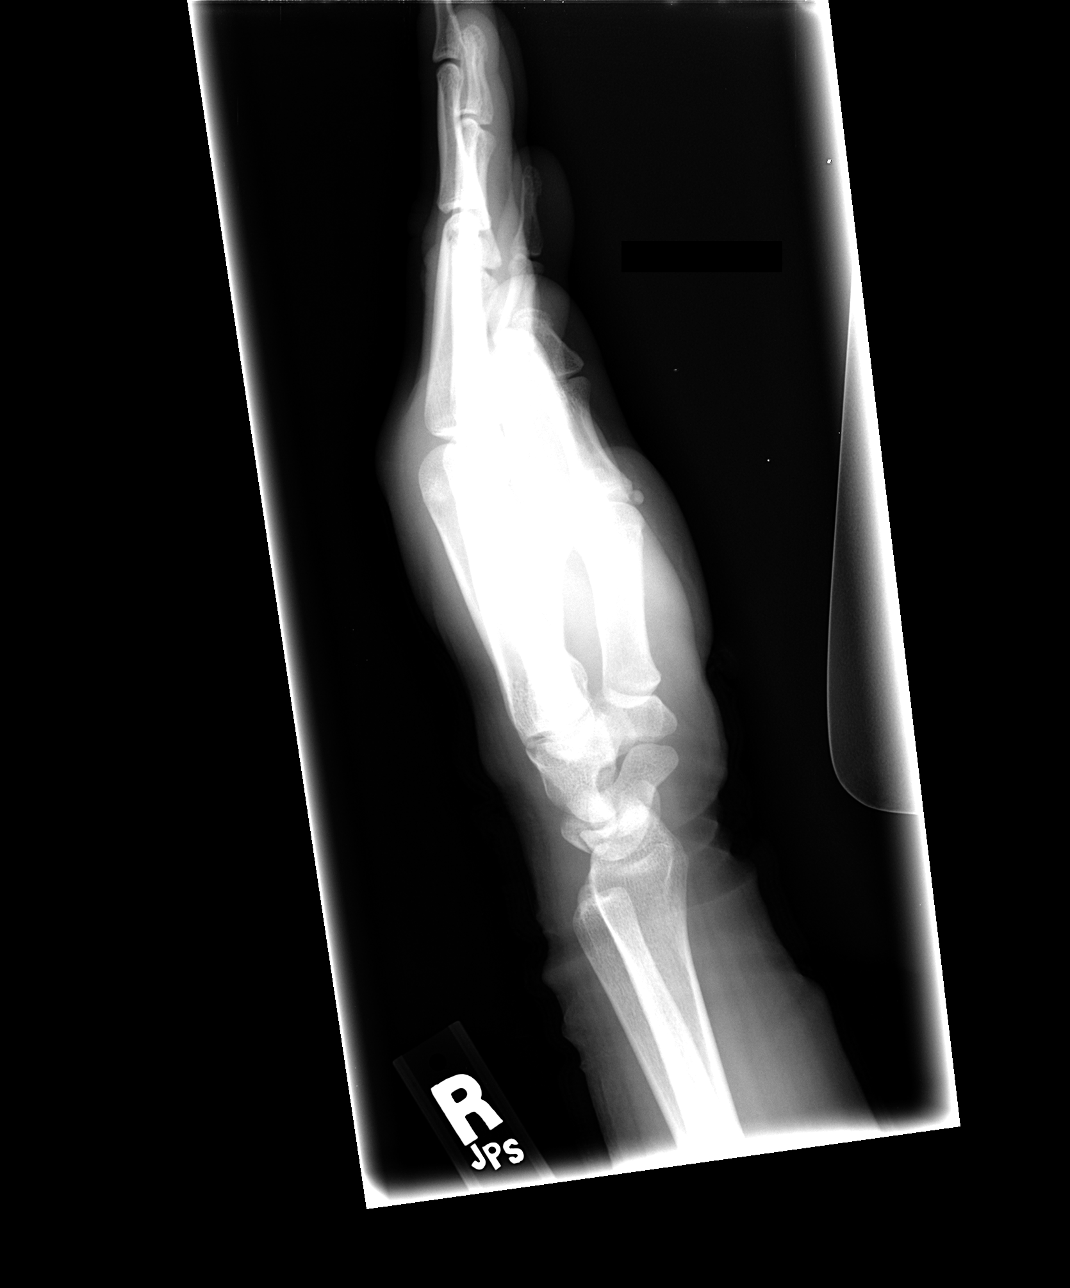

[4 of 4 positions shown; findings below may reference images not displayed]

FINDINGS: Lateral projections limited by superimposition of the
fingers.  Within the provided views, there is no evidence for an
acute fracture in the right hand.  No evidence for subluxation or
dislocation.  Carpal alignment appears anatomic.
IMPRESSION: Normal exam.

## 2012-08-03 IMAGING — CR DG ANKLE COMPLETE 3+V*R*
3 series · 3 of 3 positions shown · non-contrast
Comparison: 07/01/2010

CLINICAL DATA: Ankle fracture.

RIGHT ANKLE - COMPLETE 3+ VIEW

[view not recorded (1 of 3)]
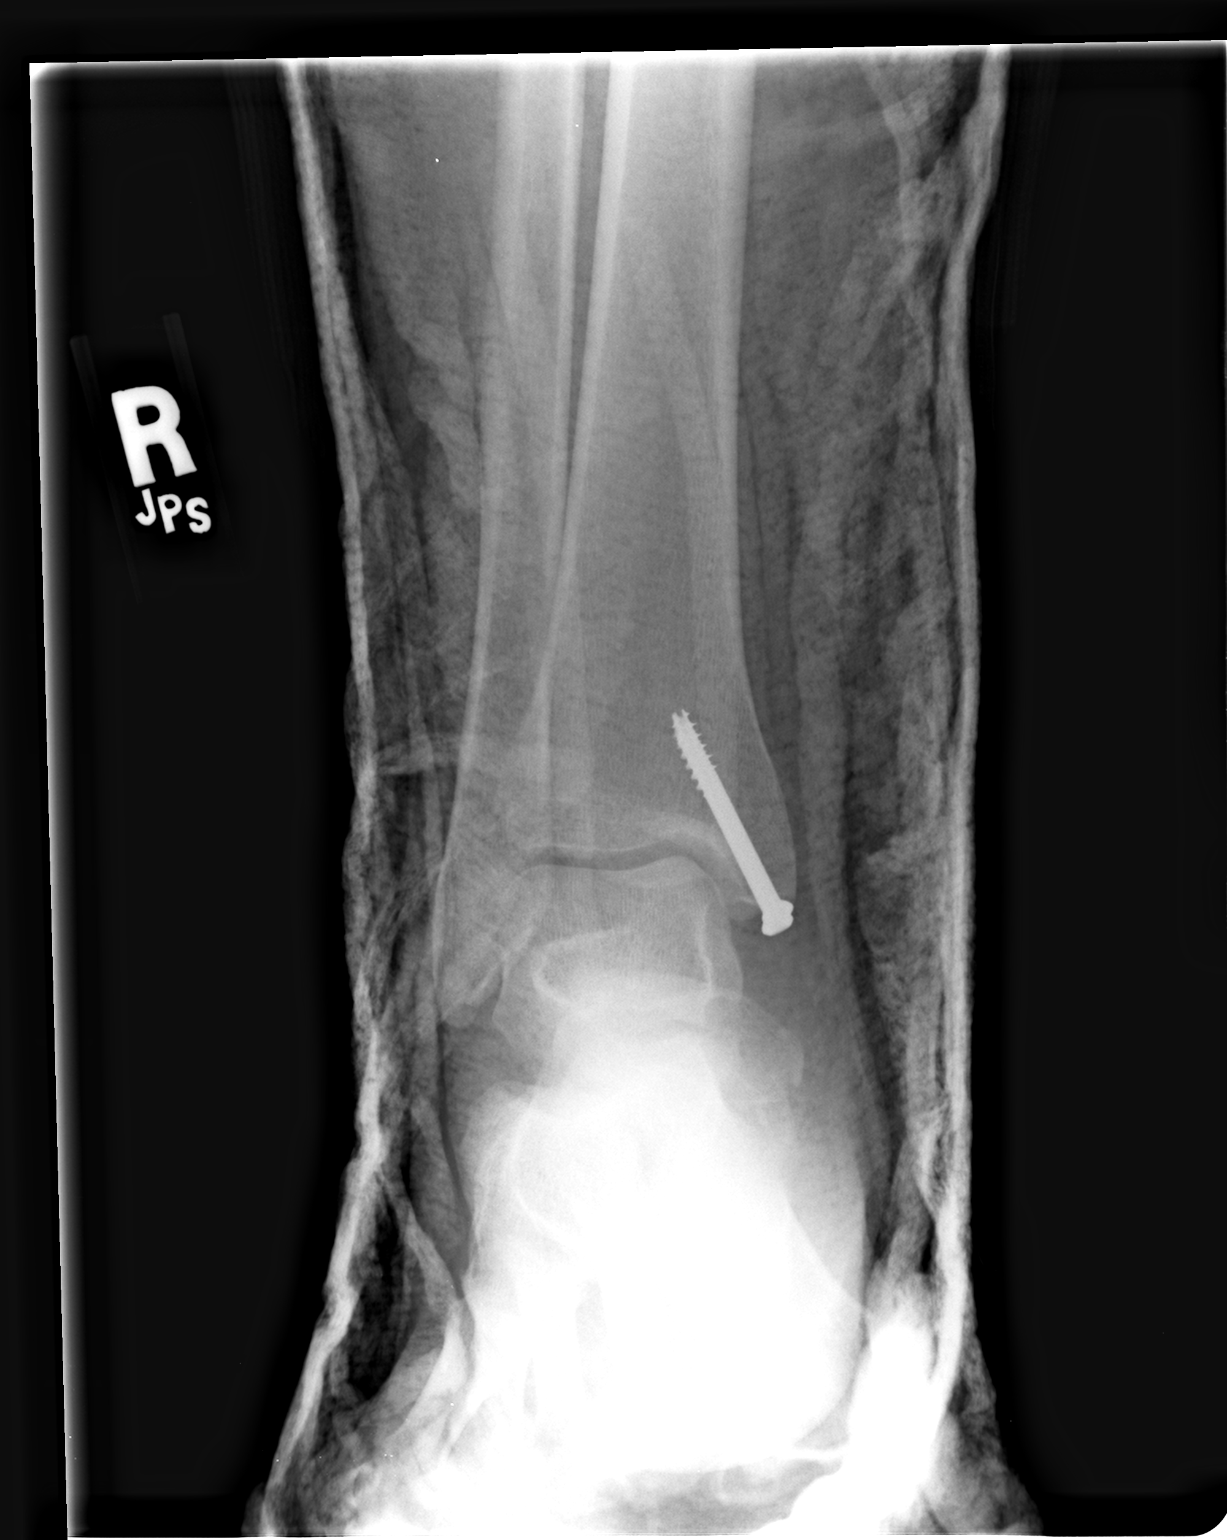

[view not recorded (2 of 3)]
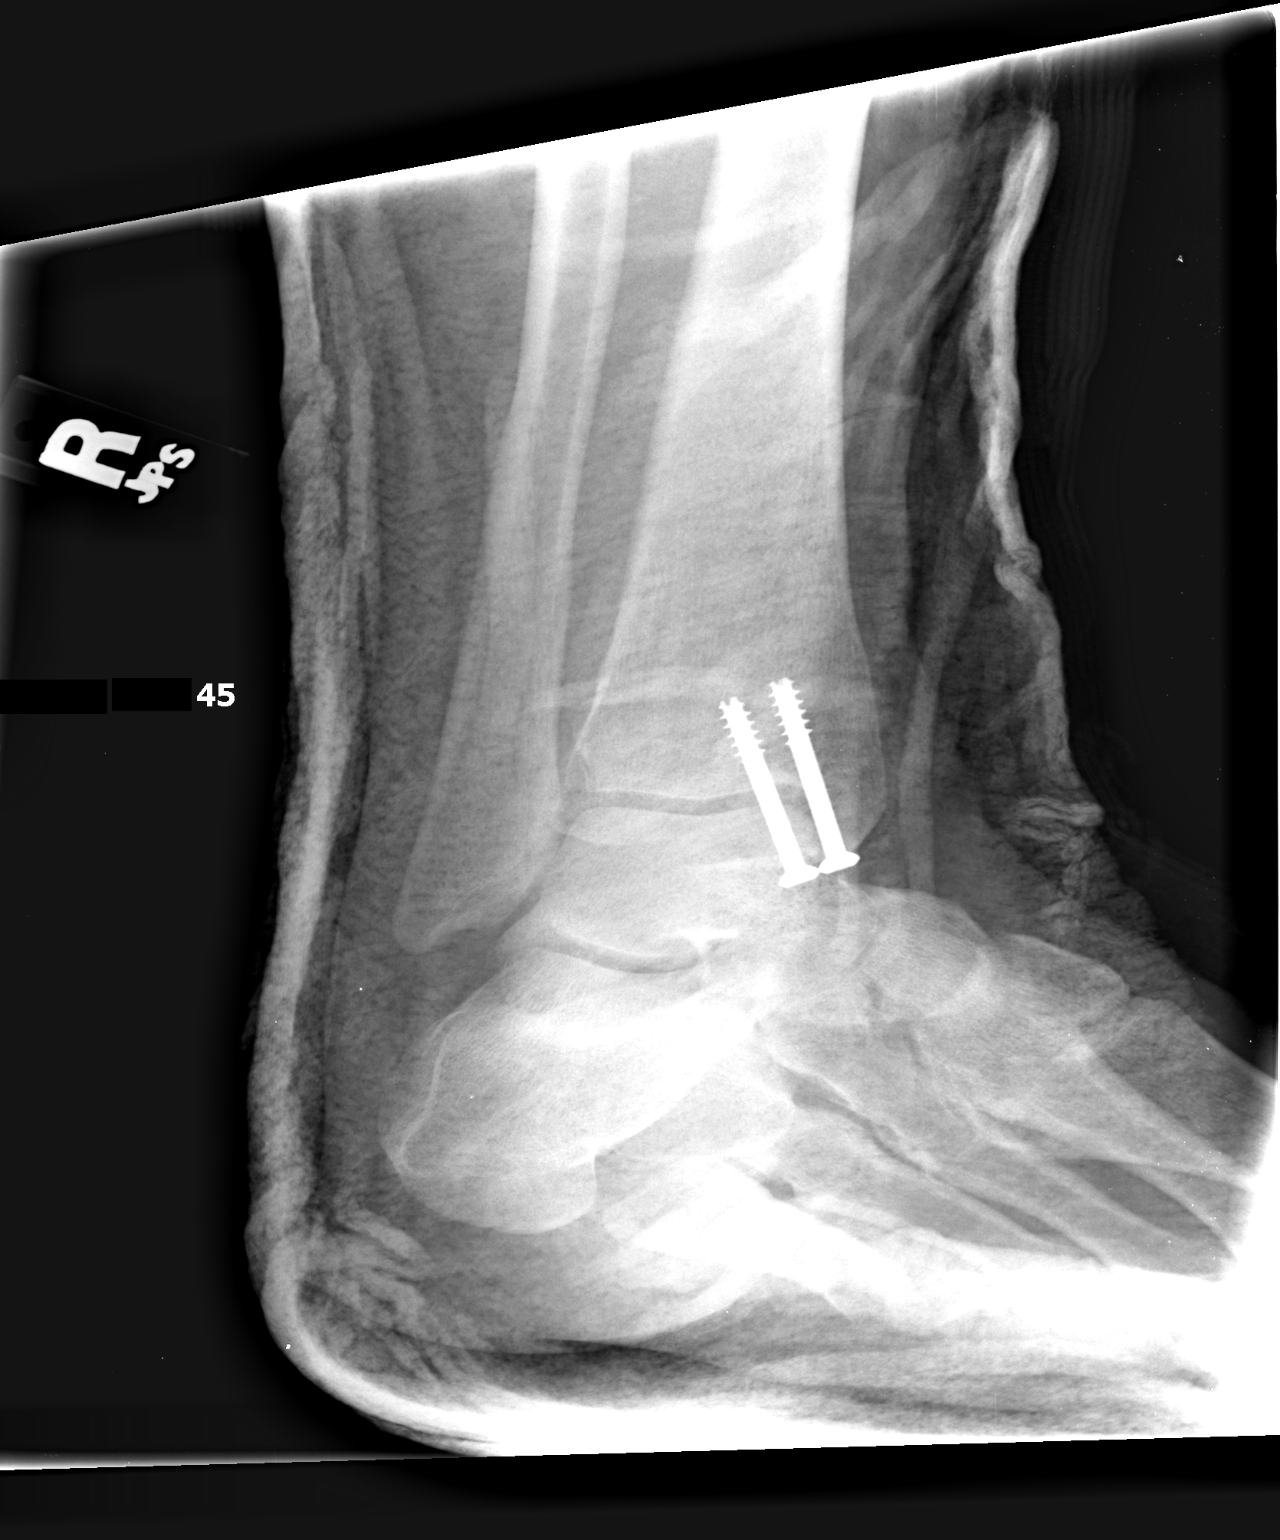

[view not recorded (3 of 3)]
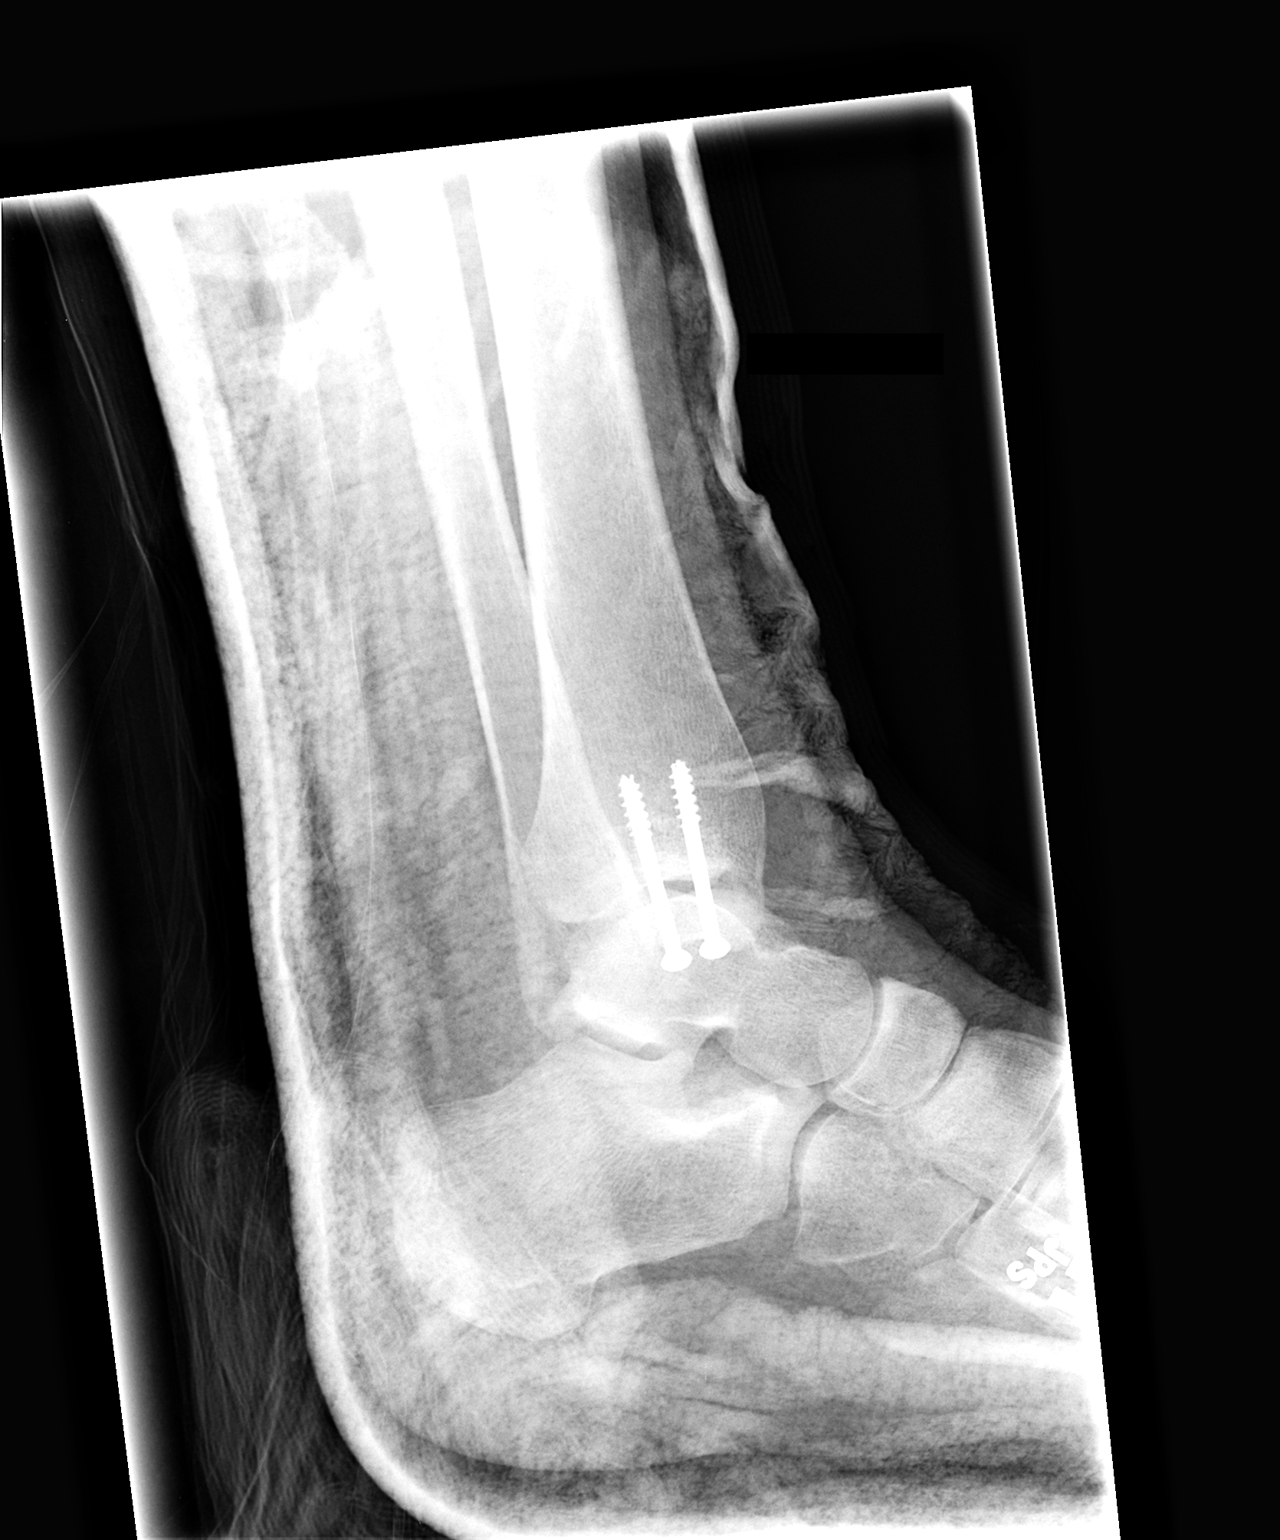

[3 of 3 positions shown; findings below may reference images not displayed]

FINDINGS: Three-view exam has fine detail obscured by the overlying
plaster.  Two cannulated compression screws are seen in the medial
malleolus, crossing a medial malleolar fracture best seen on the
films from 06/26/2010.
IMPRESSION: Stable appearance status post ORIF for medial malleolar fracture.

## 2012-11-30 DIAGNOSIS — R0602 Shortness of breath: Secondary | ICD-10-CM

## 2013-09-09 ENCOUNTER — Emergency Department (HOSPITAL_COMMUNITY)
Admission: EM | Admit: 2013-09-09 | Discharge: 2013-09-09 | Disposition: A | Discharge status: Home / Self Care | Payer: Medicaid Other | Attending: Emergency Medicine | Admitting: Emergency Medicine

## 2013-09-09 ENCOUNTER — Encounter (HOSPITAL_COMMUNITY): Payer: Self-pay | Admitting: Emergency Medicine

## 2013-09-09 DIAGNOSIS — Z8669 Personal history of other diseases of the nervous system and sense organs: Secondary | ICD-10-CM | POA: Insufficient documentation

## 2013-09-09 DIAGNOSIS — F411 Generalized anxiety disorder: Secondary | ICD-10-CM | POA: Insufficient documentation

## 2013-09-09 DIAGNOSIS — Z87440 Personal history of urinary (tract) infections: Secondary | ICD-10-CM | POA: Insufficient documentation

## 2013-09-09 DIAGNOSIS — Z8541 Personal history of malignant neoplasm of cervix uteri: Secondary | ICD-10-CM | POA: Insufficient documentation

## 2013-09-09 DIAGNOSIS — M5410 Radiculopathy, site unspecified: Secondary | ICD-10-CM

## 2013-09-09 DIAGNOSIS — F17200 Nicotine dependence, unspecified, uncomplicated: Secondary | ICD-10-CM | POA: Insufficient documentation

## 2013-09-09 DIAGNOSIS — Z8719 Personal history of other diseases of the digestive system: Secondary | ICD-10-CM | POA: Insufficient documentation

## 2013-09-09 DIAGNOSIS — Z79899 Other long term (current) drug therapy: Secondary | ICD-10-CM | POA: Insufficient documentation

## 2013-09-09 DIAGNOSIS — M545 Low back pain, unspecified: Secondary | ICD-10-CM | POA: Insufficient documentation

## 2013-09-09 DIAGNOSIS — IMO0002 Reserved for concepts with insufficient information to code with codable children: Secondary | ICD-10-CM | POA: Insufficient documentation

## 2013-09-09 DIAGNOSIS — Z8781 Personal history of (healed) traumatic fracture: Secondary | ICD-10-CM | POA: Insufficient documentation

## 2013-09-09 DIAGNOSIS — G8929 Other chronic pain: Secondary | ICD-10-CM | POA: Insufficient documentation

## 2013-09-09 DIAGNOSIS — Z87828 Personal history of other (healed) physical injury and trauma: Secondary | ICD-10-CM | POA: Insufficient documentation

## 2013-09-09 DIAGNOSIS — M549 Dorsalgia, unspecified: Secondary | ICD-10-CM

## 2013-09-09 DIAGNOSIS — Z87442 Personal history of urinary calculi: Secondary | ICD-10-CM | POA: Insufficient documentation

## 2013-09-09 MED ORDER — DIAZEPAM 5 MG PO TABS
5.0000 mg | ORAL_TABLET | Freq: Once | ORAL | Status: AC
  Administered 2013-09-09: 5 mg via ORAL
  Filled 2013-09-09: qty 1

## 2013-09-09 MED ORDER — CYCLOBENZAPRINE HCL 10 MG PO TABS: 10.0000 mg | ORAL_TABLET | Freq: Three times a day (TID) | ORAL | Status: DC

## 2013-09-09 MED ORDER — ONDANSETRON HCL 4 MG PO TABS
4.0000 mg | ORAL_TABLET | Freq: Once | ORAL | Status: AC
  Administered 2013-09-09: 4 mg via ORAL
  Filled 2013-09-09: qty 1

## 2013-09-09 MED ORDER — TRAMADOL HCL 50 MG PO TABS
100.0000 mg | ORAL_TABLET | Freq: Once | ORAL | Status: DC
  Filled 2013-09-09: qty 2

## 2013-09-09 NOTE — Discharge Instructions (Signed)
Your examination reveals some mild spasm of your lumbar paraspinal area. There no gross neurologic deficits appreciated at this time. Please use the crutches to help prevent falls.  Back Pain, Adult Back pain is very common. The pain often gets better over time. The cause of back pain is usually not dangerous. Most people can learn to manage their back pain on their own.  HOME CARE   Stay active. Start with short walks on flat ground if you can. Try to walk farther each day.  Do not sit, drive, or stand in one place for more than 30 minutes. Do not stay in bed.  Do not avoid exercise or work. Activity can help your back heal faster.  Be careful when you bend or lift an object. Bend at your knees, keep the object close to you, and do not twist.  Sleep on a firm mattress. Lie on your side, and bend your knees. If you lie on your back, put a pillow under your knees.  Only take medicines as told by your doctor.  Put ice on the injured area.  Put ice in a plastic bag.  Place a towel between your skin and the bag.  Leave the ice on for 15-20 minutes, 03-04 times a day for the first 2 to 3 days. After that, you can switch between ice and heat packs.  Ask your doctor about back exercises or massage.  Avoid feeling anxious or stressed. Find good ways to deal with stress, such as exercise. GET HELP RIGHT AWAY IF:   Your pain does not go away with rest or medicine.  Your pain does not go away in 1 week.  You have new problems.  You do not feel well.  The pain spreads into your legs.  You cannot control when you poop (bowel movement) or pee (urinate).  Your arms or legs feel weak or lose feeling (numbness).  You feel sick to your stomach (nauseous) or throw up (vomit).  You have belly (abdominal) pain.  You feel like you may pass out (faint). MAKE SURE YOU:   Understand these instructions.  Will watch your condition.  Will get help right away if you are not doing well or  get worse. Document Released: 09/02/2007 Document Revised: 06/08/2011 Document Reviewed: 08/04/2010 [facility] Patient Information 2014 [place].  Please contact the [facility], or call the [facility] [place] at [phone] and asked for physicians taking new patients. Please see Dr. Marcelino Scot, or member of his team concerning your back and legs. Back Pain, Adult Back pain is very common. The pain often gets better over time. The cause of back pain is usually not dangerous. Most people can learn to manage their back pain on their own.  HOME CARE   Stay active. Start with short walks on flat ground if you can. Try to walk farther each day.  Do not sit, drive, or stand in one place for more than 30 minutes. Do not stay in bed.  Do not avoid exercise or work. Activity can help your back heal faster.  Be careful when you bend or lift an object. Bend at your knees, keep the object close to you, and do not twist.  Sleep on a firm mattress. Lie on your side, and bend your knees. If you lie on your back, put a pillow under your knees.  Only take medicines as told by your doctor.  Put ice on the injured area.  Put ice in a plastic bag.  Place a towel between your skin and the bag.  Leave the ice on for 15-20 minutes, 03-04 times a day for the first 2 to 3 days. After that, you can switch between ice and heat packs.  Ask your doctor about back exercises or massage.  Avoid feeling anxious or stressed. Find good ways to deal with stress, such as exercise. GET HELP RIGHT AWAY IF:   Your pain does not go away with rest or medicine.  Your pain does not go away in 1 week.  You have new problems.  You do not feel well.  The pain spreads into your legs.  You cannot control when you poop (bowel movement) or pee (urinate).  Your arms or legs feel weak or lose feeling (numbness).  You feel sick to your stomach (nauseous) or throw up (vomit).  You have belly (abdominal) pain.  You  feel like you may pass out (faint). MAKE SURE YOU:   Understand these instructions.  Will watch your condition.  Will get help right away if you are not doing well or get worse. Document Released: 09/02/2007 Document Revised: 06/08/2011 Document Reviewed: 08/04/2010 [facility] Patient Information 2014 [place].

## 2013-09-09 NOTE — ED Notes (Signed)
Pt c/o back pain and bilateral leg pain over the last few days; pt states she has fallen a few times over last few days

## 2013-09-09 NOTE — ED Notes (Signed)
Patient refused Tramadol stating, "It makes me sick to my stomach."  When told I would fit her for crutches, significant other stated, "Ya'll give crutches to someone at risk for falling?"  Explained that since patient's risk was due to legs giving out and numbness in LEs, crutches would help her support her weight and relieve load bearing on lower back.  Patient was fitted w/crutches and observed using them appropriately.

## 2013-09-09 NOTE — ED Notes (Signed)
Patient refused to sign d/c stating "he (referring to significant other) can sign for me."  Significant other not in room, requested she send him in.  She abruptly stated "I will if I can find him," and walked out, ignoring further requests to s/o.

## 2013-09-09 NOTE — ED Notes (Signed)
Patient in [place] in 2012 w/pelvic reconstruction surgery d/t crush injury.  Over last 3 months, patient has had progressive problems w/lumbar pain radiating down back of legs w/associated numbness and tingling.  No loss of bowel or bladder control, but does have problems initiating urine flow.  Has had increasing number of falls d/t "legs giving out".  Difficulty finding position of comfort.  Has tried multiple OTC pain relievers w/out relief. States she took 1 5/325 Percocet with minimal relief.  Has spoken w/her surgeon, Dr. Marcelino Scot, who states she needs a new referral in order to see him.

## 2013-09-09 NOTE — ED Provider Notes (Signed)
CSN: [id number]     Arrival date & time 09/09/13  7425 History   First MD Initiated Contact with Patient 09/09/13 1059     Chief Complaint  Patient presents with  . Leg Pain     (Consider location/radiation/quality/duration/timing/severity/associated sxs/prior Treatment) HPI Comments: Patient is a 28 year old female who presents to the emergency department with bilateral leg pain, as well as back pain. It is of note that the patient has had a pelvic fracture with a right humeral fracture in the past she has had extensive surgery and ongoing problems with pain and discomfort. The patient states that she only recently obtained a insurance.  Patient is a 28 y.o. female presenting with leg pain. The history is provided by the patient.  Leg Pain Location:  Leg Injury: no   Leg location:  L leg and R leg Pain details:    Quality:  Aching   Severity:  Moderate   Onset quality:  Gradual   Duration: Hx of back    Timing:  Intermittent   Progression:  Worsening Chronicity:  Chronic Dislocation: no   Relieved by:  Nothing Worsened by:  Nothing tried Associated symptoms: back pain and decreased ROM   Associated symptoms: no neck pain   Risk factors: no frequent fractures and no recent illness     Past Medical History  Diagnosis Date  . Blood transfusion   . Pelvic fracture   . Wound of buttock   . Pain in right buttock   . Right humeral fracture   . Bell's palsy     28 yrs old  . UTI (urinary tract infection)   . GERD (gastroesophageal reflux disease)     occ  . Anxiety   . Cervical cancer 2010    cervical/ s/p total hysterectomy  . Kidney stone    Past Surgical History  Procedure Laterality Date  . Hip surgery    . Abdominal hysterectomy  2010    cervical cancer  . Ankle arthroplasty      plates not replacement  . Bony pelvis surgery      fx pelvis, fixation  . Humerus fracture surgery    . Sacro-iliac pinning  08/28/2011    Procedure: SACRO-ILIAC PINNING;  Surgeon:  Rozanna Box, MD;  Location: [place];  Service: Orthopedics;  Laterality: Left;  REPAIR OF LEFT SACRUM NONUNION  . Fracture surgery    . Fracture surgery      pelvis, rt arm   History reviewed. No pertinent family history. History  Substance Use Topics  . Smoking status: Current Some Day Smoker -- 0.50 packs/day for 12 years    Types: Cigarettes  . Smokeless tobacco: Not on file     Comment: teenager  . Alcohol Use: No   OB History   Grav Para Term Preterm Abortions TAB SAB Ect Mult Living                 Review of Systems  Constitutional: Negative for activity change.       All ROS Neg except as noted in HPI  HENT: Negative for nosebleeds.   Eyes: Negative for photophobia and discharge.  Respiratory: Negative for cough, shortness of breath and wheezing.   Cardiovascular: Negative for chest pain and palpitations.  Gastrointestinal: Negative for abdominal pain and blood in stool.  Genitourinary: Negative for dysuria, frequency and hematuria.  Musculoskeletal: Positive for arthralgias and back pain. Negative for neck pain.  Skin: Negative.   Neurological: Negative for dizziness, seizures  and speech difficulty.  Psychiatric/Behavioral: Negative for hallucinations and confusion. The patient is nervous/anxious.       Allergies  Aleve; Aspirin-acetaminophen-caffeine; Metronidazole; and Naproxen sodium  Home Medications   Prior to Admission medications   Medication Sig Start Date End Date Taking? Authorizing Provider  albuterol (PROVENTIL HFA;VENTOLIN HFA) 108 (90 BASE) MCG/ACT inhaler Inhale 2 puffs into the lungs every 6 (six) hours as needed. Chest tightness    Historical Provider, MD  ALPRAZolam Duanne Moron) 1 MG tablet Take 1 mg by mouth 4 (four) times daily.     Historical Provider, MD  amoxicillin (AMOXIL) 250 MG/5ML suspension Take 10 mLs (500 mg total) by mouth 3 (three) times daily. 04/27/12   Evalee Jefferson, PA-C  ibuprofen (ADVIL,MOTRIN) 200 MG tablet Take 1,200 mg by mouth  3 (three) times daily. For pain    Historical Provider, MD  methylphenidate (RITALIN) 10 MG tablet Take 10 mg by mouth 3 (three) times daily.    Historical Provider, MD   BP 103/66  Pulse 87  Temp(Src) 98.3 F (36.8 C) (Oral)  Resp 18  Ht 5\' 4"  (1.626 m)  Wt 118 lb (53.524 kg)  BMI 20.24 kg/m2  SpO2 99% Physical Exam  Nursing note and vitals reviewed. Constitutional: She is oriented to person, place, and time. She appears well-developed and well-nourished.  Non-toxic appearance.  HENT:  Head: Normocephalic.  Right Ear: Tympanic membrane and external ear normal.  Left Ear: Tympanic membrane and external ear normal.  Eyes: EOM and lids are normal. Pupils are equal, round, and reactive to light.  Neck: Normal range of motion. Neck supple. Carotid bruit is not present.  Cardiovascular: Normal rate, regular rhythm, normal heart sounds, intact distal pulses and normal pulses.   Pulmonary/Chest: Breath sounds normal. No respiratory distress.  Abdominal: Soft. Bowel sounds are normal. There is no tenderness. There is no guarding.  Musculoskeletal: Normal range of motion.  There is pain to palpation of the lumbar area. There is paraspinal area tenderness. And questionable some spasm. No hot areas appreciated. No palpable step off of the lumbar spine appreciated.  Lymphadenopathy:       Head (right side): No submandibular adenopathy present.       Head (left side): No submandibular adenopathy present.    She has no cervical adenopathy.  Neurological: She is alert and oriented to person, place, and time. She has normal strength. No cranial nerve deficit or sensory deficit.  There no gross sensory deficits appreciated of the lower extremities. A detailed examination of the motor strength cannot be completed because the patient is resistant to do to pain.  Skin: Skin is warm and dry.  Psychiatric: She has a normal mood and affect. Her speech is normal.  Patient appears somewhat agitated during  the examination.    ED Course  Procedures (including critical care time) Labs Review Labs Reviewed - No data to display  Imaging Review No results found.   EKG Interpretation None      MDM I have reviewed previous records.  Vital signs are well within normal limits. Pulse oximetry is 99% on room air. No gross neurologic deficits noted on limited examination at this time. I suspect the patient has A. chronic back related problem given the extensive pelvic fractures and surgery that she has had that is now being accompanied by a radicular pain of the lower extremities. The patient lists as allergies Aleve, aspirin-acetaminophen-codeine, metronidazole, and naproxen. I offered the patient Flexeril and Ultram. The patient then stated that  she was also allergic to Ultram. The patient received Flexeril and was advised to continue the medications that she had been using, as her home medications indicate the use of Goody powders and Advil. Mother offered the patient crutches as she and her significant other state that she has been having some falls recently because she feels that her legs are weak and maybe giving away on her. I discussed this plan with the patient in detail and while she was reluctant to receive the plan she did acknowledge the plan.  After the patient was discharged I was informed by nursing staff that the patient was causing a very loudly as she was leaving the premises, and actually through the crutches after she got outside the emergency department. She complained that she did not receive Percocet for her discomfort.    Final diagnoses:  None    **I have reviewed nursing notes, vital signs, and all appropriate lab and imaging results for this patient.Lenox Ahr, PA-C 09/09/13 1815

## 2013-09-10 NOTE — ED Provider Notes (Signed)
Medical screening examination/treatment/procedure(s) were performed by non-physician practitioner and as supervising physician I was immediately available for consultation/collaboration.   EKG Interpretation None       Orlie Dakin, MD 09/10/13 1749

## 2013-09-26 ENCOUNTER — Telehealth: Payer: Self-pay | Admitting: Family Medicine

## 2013-09-27 NOTE — Telephone Encounter (Signed)
Patient aware we do not prescribe pain medication

## 2013-12-12 ENCOUNTER — Emergency Department (HOSPITAL_COMMUNITY)
Admission: EM | Admit: 2013-12-12 | Discharge: 2013-12-12 | Disposition: A | Discharge status: Home / Self Care | Payer: Medicaid Other | Attending: Emergency Medicine | Admitting: Emergency Medicine

## 2013-12-12 ENCOUNTER — Emergency Department (HOSPITAL_COMMUNITY): Payer: Medicaid Other

## 2013-12-12 ENCOUNTER — Encounter (HOSPITAL_COMMUNITY): Payer: Self-pay | Admitting: Emergency Medicine

## 2013-12-12 DIAGNOSIS — F411 Generalized anxiety disorder: Secondary | ICD-10-CM | POA: Insufficient documentation

## 2013-12-12 DIAGNOSIS — Z8739 Personal history of other diseases of the musculoskeletal system and connective tissue: Secondary | ICD-10-CM | POA: Diagnosis not present

## 2013-12-12 DIAGNOSIS — Z8781 Personal history of (healed) traumatic fracture: Secondary | ICD-10-CM | POA: Insufficient documentation

## 2013-12-12 DIAGNOSIS — K219 Gastro-esophageal reflux disease without esophagitis: Secondary | ICD-10-CM | POA: Diagnosis not present

## 2013-12-12 DIAGNOSIS — L03211 Cellulitis of face: Secondary | ICD-10-CM

## 2013-12-12 DIAGNOSIS — Z8541 Personal history of malignant neoplasm of cervix uteri: Secondary | ICD-10-CM | POA: Diagnosis not present

## 2013-12-12 DIAGNOSIS — L0201 Cutaneous abscess of face: Secondary | ICD-10-CM | POA: Insufficient documentation

## 2013-12-12 DIAGNOSIS — Z87440 Personal history of urinary (tract) infections: Secondary | ICD-10-CM | POA: Diagnosis not present

## 2013-12-12 DIAGNOSIS — Z79899 Other long term (current) drug therapy: Secondary | ICD-10-CM | POA: Diagnosis not present

## 2013-12-12 DIAGNOSIS — Z87442 Personal history of urinary calculi: Secondary | ICD-10-CM | POA: Insufficient documentation

## 2013-12-12 DIAGNOSIS — F17200 Nicotine dependence, unspecified, uncomplicated: Secondary | ICD-10-CM | POA: Diagnosis not present

## 2013-12-12 LAB — BASIC METABOLIC PANEL
Anion gap: 10 (ref 5–15)
BUN: 7 mg/dL (ref 6–23)
CO2: 27 mEq/L (ref 19–32)
Calcium: 9 mg/dL (ref 8.4–10.5)
Chloride: 101 mEq/L (ref 96–112)
Creatinine, Ser: 0.49 mg/dL — ABNORMAL LOW (ref 0.50–1.10)
GFR calc Af Amer: >90 mL/min (ref >90)
GFR calc non Af Amer: >90 mL/min (ref >90)
Glucose, Bld: 82 mg/dL (ref 70–99)
Potassium: 3.9 mEq/L (ref 3.7–5.3)
Sodium: 138 mEq/L (ref 137–147)

## 2013-12-12 LAB — CBC WITH DIFFERENTIAL/PLATELET
Basophils Absolute: 0.1 10*3/uL (ref 0.0–0.1)
Basophils Relative: 1 % (ref 0–1)
Eosinophils Absolute: 0.3 10*3/uL (ref 0.0–0.7)
Eosinophils Relative: 3 % (ref 0–5)
HCT: 41.4 % (ref 36.0–46.0)
Hemoglobin: 14.3 g/dL (ref 12.0–15.0)
Lymphocytes Relative: 24 % (ref 12–46)
Lymphs Abs: 2.2 10*3/uL (ref 0.7–4.0)
MCH: 32.7 pg (ref 26.0–34.0)
MCHC: 34.5 g/dL (ref 30.0–36.0)
MCV: 94.7 fL (ref 78.0–100.0)
Monocytes Absolute: 0.8 10*3/uL (ref 0.1–1.0)
Monocytes Relative: 9 % (ref 3–12)
Neutro Abs: 5.7 10*3/uL (ref 1.7–7.7)
Neutrophils Relative %: 63 % (ref 43–77)
Platelets: 213 10*3/uL (ref 150–400)
RBC: 4.37 MIL/uL (ref 3.87–5.11)
RDW: 13.5 % (ref 11.5–15.5)
WBC: 9 10*3/uL (ref 4.0–10.5)

## 2013-12-12 MED ORDER — OXYCODONE-ACETAMINOPHEN 5-325 MG PO TABS: 2.0000 | ORAL_TABLET | ORAL | Status: DC | PRN

## 2013-12-12 MED ORDER — OXYCODONE-ACETAMINOPHEN 5-325 MG PO TABS
1.0000 | ORAL_TABLET | Freq: Once | ORAL | Status: AC
  Administered 2013-12-12: 1 via ORAL
  Filled 2013-12-12: qty 1

## 2013-12-12 MED ORDER — ONDANSETRON HCL 4 MG/2ML IJ SOLN
4.0000 mg | Freq: Once | INTRAMUSCULAR | Status: AC
  Administered 2013-12-12: 4 mg via INTRAVENOUS
  Filled 2013-12-12: qty 2

## 2013-12-12 MED ORDER — SODIUM CHLORIDE 0.9 % IV SOLN
3.0000 g | Freq: Once | INTRAVENOUS | Status: AC
  Administered 2013-12-12: 3 g via INTRAVENOUS
  Filled 2013-12-12: qty 3

## 2013-12-12 MED ORDER — MORPHINE SULFATE 4 MG/ML IJ SOLN
4.0000 mg | Freq: Once | INTRAMUSCULAR | Status: AC
  Administered 2013-12-12: 4 mg via INTRAVENOUS
  Filled 2013-12-12: qty 1

## 2013-12-12 MED ORDER — IOHEXOL 300 MG/ML  SOLN
75.0000 mL | Freq: Once | INTRAMUSCULAR | Status: AC | PRN
  Administered 2013-12-12: 75 mL via INTRAVENOUS

## 2013-12-12 MED ORDER — DOXYCYCLINE HYCLATE 100 MG PO CAPS
100.0000 mg | ORAL_CAPSULE | Freq: Two times a day (BID) | ORAL | Status: DC
Start: 2013-12-12 — End: 2014-02-27

## 2013-12-12 NOTE — Discharge Instructions (Signed)

## 2013-12-12 NOTE — ED Notes (Signed)
Pt reports abscess to r jaw x 1 week.  Reports started as a pimple but started swelling yesterday.

## 2013-12-12 NOTE — ED Notes (Signed)
Pain to right jaw, rates pain 7.  Started with pimple to right lower jaw with increasing swelling and pin this am.  Took tylenol and BC's with no relief.  Also used heating pad prn.

## 2013-12-12 NOTE — ED Provider Notes (Signed)
CSN: [id number]     Arrival date & time 12/12/13  1022 History  This chart was scribed for Sharyon Cable, MD by Rayfield Citizen, ED Scribe. This patient was seen in room APA14/APA14 and the patient's care was started at 10:30 AM.   Patient gave verbal permission to utilize photo for medical documentation only The image was not stored on any personal device  Chief Complaint  Patient presents with  . Abscess   Patient is a 28 y.o. female presenting with abscess. The history is provided by the patient. No language interpreter was used.  Abscess Location:  Face Facial abscess location:  R cheek Abscess quality: painful   Duration:  1 day Progression:  Worsening Pain details:    Duration:  1 day   Timing:  Constant Chronicity:  New Relieved by:  None tried Worsened by:  Nothing tried Ineffective treatments:  None tried Associated symptoms: no fever     HPI Comments: [name] is a 29 y.o. female who presents to the Emergency Department complaining of gradual onset, constant, worsening painful swelling on the right side of her jaw, beginning yesterday. She notes pain under the right side of her jaw along with swelling in her gums. Patient denies difficulty swallowing, but finds it difficult to move her mouth. She also denies abdominal or back pain.    Past Medical History  Diagnosis Date  . Blood transfusion   . Pelvic fracture   . Wound of buttock   . Pain in right buttock   . Right humeral fracture   . Bell's palsy     28 yrs old  . UTI (urinary tract infection)   . GERD (gastroesophageal reflux disease)     occ  . Anxiety   . Cervical cancer 2010    cervical/ s/p total hysterectomy  . Kidney stone    Past Surgical History  Procedure Laterality Date  . Hip surgery    . Abdominal hysterectomy  2010    cervical cancer  . Ankle arthroplasty      plates not replacement  . Bony pelvis surgery      fx pelvis, fixation  . Humerus fracture surgery    . Sacro-iliac  pinning  08/28/2011    Procedure: SACRO-ILIAC PINNING;  Surgeon: Rozanna Box, MD;  Location: [facility];  Service: Orthopedics;  Laterality: Left;  REPAIR OF LEFT SACRUM NONUNION  . Fracture surgery    . Fracture surgery      pelvis, rt arm   No family history on file. History  Substance Use Topics  . Smoking status: Current Some Day Smoker -- 0.50 packs/day for 12 years    Types: Cigarettes  . Smokeless tobacco: Not on file     Comment: teenager  . Alcohol Use: No   OB History   Grav Para Term Preterm Abortions TAB SAB Ect Mult Living                 Review of Systems  Constitutional: Negative for fever.  HENT: Positive for facial swelling. Negative for dental problem and trouble swallowing.   Gastrointestinal: Negative for abdominal pain.  Musculoskeletal: Negative for back pain.  All other systems reviewed and are negative.   Allergies  Aleve; Aspirin-acetaminophen-caffeine; Metronidazole; and Naproxen sodium  Home Medications   Prior to Admission medications   Medication Sig Start Date End Date Taking? Authorizing Provider  albuterol (PROVENTIL HFA;VENTOLIN HFA) 108 (90 BASE) MCG/ACT inhaler Inhale 2 puffs into the lungs every  6 (six) hours as needed. Chest tightness   Yes Historical Provider, MD  ALPRAZolam Duanne Moron) 1 MG tablet Take 1 mg by mouth 4 (four) times daily.   Yes Historical Provider, MD  gabapentin (NEURONTIN) 300 MG capsule Take 300-600 mg by mouth as needed (leg pain).   Yes Historical Provider, MD  omeprazole (PRILOSEC) 20 MG capsule Take 20 mg by mouth daily.   Yes Historical Provider, MD  doxycycline (VIBRAMYCIN) 100 MG capsule Take 1 capsule (100 mg total) by mouth 2 (two) times daily. 12/12/13   Sharyon Cable, MD  oxyCODONE-acetaminophen (PERCOCET/ROXICET) 5-325 MG per tablet Take 2 tablets by mouth every 4 (four) hours as needed for severe pain. 12/12/13   Sharyon Cable, MD   BP 117/81  Pulse 93  Temp(Src) 98.6 F (37 C) (Oral)  Resp 20  Ht 5'  4" (1.626 m)  Wt 124 lb (56.246 kg)  BMI 21.27 kg/m2  SpO2 100% Physical Exam  Nursing note and vitals reviewed.  CONSTITUTIONAL: Well developed/well nourished HEAD: Normocephalic/atraumatic EYES: EOMI/PERRL ENMT: Mucous membranes moist, no trismus, there is gingival tenderness noted without fluctuance, see photo below NECK: supple no meningeal signs SPINE:entire spine nontender CV: S1/S2 noted, no murmurs/rubs/gallops noted LUNGS: Lungs are clear to auscultation bilaterally, no apparent distress ABDOMEN: soft, nontender, no rebound or guarding GU:no cva tenderness NEURO: Pt is awake/alert, moves all extremitiesx4 EXTREMITIES: pulses normal, full ROM SKIN: warm, color normal PSYCH: no abnormalities of mood noted     ED Course  Procedures (including critical care time)  DIAGNOSTIC STUDIES: Oxygen Saturation is 100% on RA, normal by my interpretation.    COORDINATION OF CARE:  10:42 AM Discussed imaging studies (CT maxillofacial) and bloodwork (BMP and CBC with differential). Patient will be given pain medication and antibiotics. Patient agreed to treatment plan.  Initial exam was difficult to determine soft tissue cellulitis vs. Odontogenic origin 12:08 PM CT shows facial cellulitis Pt is otherwise well appearing/nontoxic Will d/c home with PO doxycycline and oral pain meds Discussed need for recheck in 48 hours if not improving with oral medications Discussed strict return precautions if worsening  Labs Review Labs Reviewed  BASIC METABOLIC PANEL - Abnormal; Notable for the following:    Creatinine, Ser 0.49 (*)    All other components within normal limits  CBC WITH DIFFERENTIAL    Imaging Review Ct Maxillofacial W/cm  12/12/2013   CLINICAL DATA:  Right facial swelling  EXAM: CT MAXILLOFACIAL WITH CONTRAST  TECHNIQUE: Multidetector CT imaging of the maxillofacial structures was performed with intravenous contrast. Multiplanar CT image reconstructions were also  generated. A small metallic BB was placed on the right temple in order to reliably differentiate right from left.  CONTRAST:  72mL OMNIPAQUE IOHEXOL 300 MG/ML  SOLN  COMPARISON:  None.  FINDINGS: Soft tissue swelling along the right side of the face along the mandible. No focal fluid collection is suggest an abscess. No bone destruction. The globes are intact. The orbital walls are intact. The orbital floors are intact. The maxilla is intact. The mandible is intact. The zygomatic arches are intact. The nasal septum is midline. There is no nasal bone fracture. The temporomandibular joints are normal.  Bilateral ethmoid mucosal thickening. Mild left maxillary sinus mucosal thickening. The visualized portions of the mastoid sinuses are well aerated.  IMPRESSION: 1. Right facial soft tissue swelling without a drainable fluid collection. The appearance is concerning for cellulitis.   Electronically Signed   By: Kathreen Devoid   On: 12/12/2013  11:52    MDM   Final diagnoses:  Facial cellulitis   Nursing notes including past medical history and social history reviewed and considered in documentation Labs/vital reviewed and considered   I personally performed the services described in this documentation, which was scribed in my presence. The recorded information has been reviewed and is accurate.       Sharyon Cable, MD 12/12/13 [phone]

## 2013-12-12 NOTE — ED Notes (Signed)
MD at bedside. 

## 2013-12-13 ENCOUNTER — Encounter (HOSPITAL_COMMUNITY): Payer: Self-pay | Admitting: Emergency Medicine

## 2013-12-13 ENCOUNTER — Emergency Department (HOSPITAL_COMMUNITY)
Admission: EM | Admit: 2013-12-13 | Discharge: 2013-12-13 | Disposition: A | Discharge status: Home / Self Care | Payer: Medicaid Other | Attending: Emergency Medicine | Admitting: Emergency Medicine

## 2013-12-13 DIAGNOSIS — K219 Gastro-esophageal reflux disease without esophagitis: Secondary | ICD-10-CM | POA: Insufficient documentation

## 2013-12-13 DIAGNOSIS — Z8541 Personal history of malignant neoplasm of cervix uteri: Secondary | ICD-10-CM | POA: Diagnosis not present

## 2013-12-13 DIAGNOSIS — L0201 Cutaneous abscess of face: Secondary | ICD-10-CM | POA: Insufficient documentation

## 2013-12-13 DIAGNOSIS — L03211 Cellulitis of face: Secondary | ICD-10-CM | POA: Diagnosis present

## 2013-12-13 DIAGNOSIS — Z87440 Personal history of urinary (tract) infections: Secondary | ICD-10-CM | POA: Insufficient documentation

## 2013-12-13 DIAGNOSIS — L089 Local infection of the skin and subcutaneous tissue, unspecified: Secondary | ICD-10-CM | POA: Insufficient documentation

## 2013-12-13 DIAGNOSIS — Z8669 Personal history of other diseases of the nervous system and sense organs: Secondary | ICD-10-CM | POA: Insufficient documentation

## 2013-12-13 DIAGNOSIS — Z792 Long term (current) use of antibiotics: Secondary | ICD-10-CM | POA: Diagnosis not present

## 2013-12-13 DIAGNOSIS — F17200 Nicotine dependence, unspecified, uncomplicated: Secondary | ICD-10-CM | POA: Insufficient documentation

## 2013-12-13 DIAGNOSIS — Z79899 Other long term (current) drug therapy: Secondary | ICD-10-CM | POA: Insufficient documentation

## 2013-12-13 DIAGNOSIS — Z8781 Personal history of (healed) traumatic fracture: Secondary | ICD-10-CM | POA: Insufficient documentation

## 2013-12-13 DIAGNOSIS — F411 Generalized anxiety disorder: Secondary | ICD-10-CM | POA: Diagnosis not present

## 2013-12-13 DIAGNOSIS — Z87442 Personal history of urinary calculi: Secondary | ICD-10-CM | POA: Insufficient documentation

## 2013-12-13 LAB — COMPREHENSIVE METABOLIC PANEL
ALT: 69 U/L — ABNORMAL HIGH (ref 0–35)
AST: 51 U/L — ABNORMAL HIGH (ref 0–37)
Albumin: 3.4 g/dL — ABNORMAL LOW (ref 3.5–5.2)
Alkaline Phosphatase: 94 U/L (ref 39–117)
Anion gap: 11 (ref 5–15)
BUN: 6 mg/dL (ref 6–23)
CO2: 28 mEq/L (ref 19–32)
Calcium: 8.8 mg/dL (ref 8.4–10.5)
Chloride: 99 mEq/L (ref 96–112)
Creatinine, Ser: 0.52 mg/dL (ref 0.50–1.10)
GFR calc Af Amer: >90 mL/min (ref >90)
GFR calc non Af Amer: >90 mL/min (ref >90)
Glucose, Bld: 95 mg/dL (ref 70–99)
Potassium: 4.1 mEq/L (ref 3.7–5.3)
Sodium: 138 mEq/L (ref 137–147)
Total Bilirubin: 0.5 mg/dL (ref 0.3–1.2)
Total Protein: 6.7 g/dL (ref 6.0–8.3)

## 2013-12-13 LAB — CBC WITH DIFFERENTIAL/PLATELET
Basophils Absolute: 0.1 10*3/uL (ref 0.0–0.1)
Basophils Relative: 1 % (ref 0–1)
Eosinophils Absolute: 0.3 10*3/uL (ref 0.0–0.7)
Eosinophils Relative: 3 % (ref 0–5)
HCT: 38.7 % (ref 36.0–46.0)
Hemoglobin: 13.1 g/dL (ref 12.0–15.0)
Lymphocytes Relative: 29 % (ref 12–46)
Lymphs Abs: 2.4 10*3/uL (ref 0.7–4.0)
MCH: 32.4 pg (ref 26.0–34.0)
MCHC: 33.9 g/dL (ref 30.0–36.0)
MCV: 95.8 fL (ref 78.0–100.0)
Monocytes Absolute: 0.6 10*3/uL (ref 0.1–1.0)
Monocytes Relative: 7 % (ref 3–12)
Neutro Abs: 4.9 10*3/uL (ref 1.7–7.7)
Neutrophils Relative %: 60 % (ref 43–77)
Platelets: 202 10*3/uL (ref 150–400)
RBC: 4.04 MIL/uL (ref 3.87–5.11)
RDW: 13 % (ref 11.5–15.5)
WBC: 8.2 10*3/uL (ref 4.0–10.5)

## 2013-12-13 MED ORDER — ONDANSETRON 4 MG PO TBDP
ORAL_TABLET | ORAL | Status: DC
Start: 2013-12-13 — End: 2014-02-27

## 2013-12-13 MED ORDER — VANCOMYCIN HCL IN DEXTROSE 1-5 GM/200ML-% IV SOLN
1000.0000 mg | Freq: Once | INTRAVENOUS | Status: AC
  Administered 2013-12-13: 1000 mg via INTRAVENOUS
  Filled 2013-12-13: qty 200

## 2013-12-13 MED ORDER — ONDANSETRON HCL 4 MG/2ML IJ SOLN
4.0000 mg | Freq: Once | INTRAMUSCULAR | Status: AC
  Administered 2013-12-13: 4 mg via INTRAVENOUS
  Filled 2013-12-13: qty 2

## 2013-12-13 MED ORDER — SULFAMETHOXAZOLE-TRIMETHOPRIM 800-160 MG PO TABS: 1.0000 | ORAL_TABLET | Freq: Two times a day (BID) | ORAL | Status: DC

## 2013-12-13 MED ORDER — SODIUM CHLORIDE 0.9 % IV BOLUS (SEPSIS)
1000.0000 mL | Freq: Once | INTRAVENOUS | Status: AC
Start: 2013-12-13 — End: 2013-12-13
  Administered 2013-12-13: 1000 mL via INTRAVENOUS

## 2013-12-13 MED ORDER — HYDROMORPHONE HCL 1 MG/ML IJ SOLN
1.0000 mg | Freq: Once | INTRAMUSCULAR | Status: AC
  Administered 2013-12-13: 1 mg via INTRAVENOUS
  Filled 2013-12-13: qty 1

## 2013-12-13 MED ORDER — DIPHENHYDRAMINE HCL 50 MG/ML IJ SOLN
50.0000 mg | Freq: Once | INTRAMUSCULAR | Status: AC
  Administered 2013-12-13: 50 mg via INTRAVENOUS
  Filled 2013-12-13: qty 1

## 2013-12-13 NOTE — Discharge Instructions (Signed)
Follow up with [facility] tomorrow at 10:30am.   [address]   [phone]

## 2013-12-13 NOTE — ED Notes (Signed)
Abscess to right jaw x 3 days, seen here yesterday, given abx which pt reports taking but states is getting worse.  Now c/o vomiting.

## 2013-12-13 NOTE — ED Provider Notes (Signed)
CSN: [id number]     Arrival date & time 12/13/13  1201 History  This chart was scribed for Maudry Diego, MD by Rayfield Citizen, ED Scribe. This patient was seen in room APA09/APA09 and the patient's care was started at 2:21 PM.    Chief Complaint  Patient presents with  . Abscess   Patient is a 28 y.o. female presenting with abscess. The history is provided by the patient (the pt complains of facial swelling.  she had a ct of the face yesterday and started on doxy). No language interpreter was used.  Abscess Location:  Face Facial abscess location:  Face Abscess quality: not draining   Progression:  Worsening Chronicity:  New Context: not diabetes   Relieved by:  Nothing Associated symptoms: no fatigue and no headaches     HPI Comments: [name] is a 28 y.o. female who presents to the Emergency Department complaining of facial swelling   Past Medical History  Diagnosis Date  . Blood transfusion   . Pelvic fracture   . Wound of buttock   . Pain in right buttock   . Right humeral fracture   . Bell's palsy     28 yrs old  . UTI (urinary tract infection)   . GERD (gastroesophageal reflux disease)     occ  . Anxiety   . Cervical cancer 2010    cervical/ s/p total hysterectomy  . Kidney stone    Past Surgical History  Procedure Laterality Date  . Hip surgery    . Abdominal hysterectomy  2010    cervical cancer  . Ankle arthroplasty      plates not replacement  . Bony pelvis surgery      fx pelvis, fixation  . Humerus fracture surgery    . Sacro-iliac pinning  08/28/2011    Procedure: SACRO-ILIAC PINNING;  Surgeon: Rozanna Box, MD;  Location: [facility];  Service: Orthopedics;  Laterality: Left;  REPAIR OF LEFT SACRUM NONUNION  . Fracture surgery    . Fracture surgery      pelvis, rt arm   No family history on file. History  Substance Use Topics  . Smoking status: Current Some Day Smoker -- 0.50 packs/day for 12 years    Types: Cigarettes  . Smokeless tobacco:  Not on file     Comment: teenager  . Alcohol Use: No   OB History   Grav Para Term Preterm Abortions TAB SAB Ect Mult Living                 Review of Systems  Constitutional: Negative for appetite change and fatigue.  HENT: Negative for congestion, ear discharge and sinus pressure.   Eyes: Negative for discharge.  Respiratory: Negative for cough.   Cardiovascular: Negative for chest pain.  Gastrointestinal: Negative for abdominal pain and diarrhea.  Genitourinary: Negative for frequency and hematuria.  Musculoskeletal: Negative for back pain.  Skin: Positive for rash.  Neurological: Negative for seizures and headaches.  Psychiatric/Behavioral: Negative for hallucinations.     Allergies  Aleve; Aspirin-acetaminophen-caffeine; Metronidazole; and Naproxen sodium  Home Medications   Prior to Admission medications   Medication Sig Start Date End Date Taking? Authorizing Provider  albuterol (PROVENTIL HFA;VENTOLIN HFA) 108 (90 BASE) MCG/ACT inhaler Inhale 2 puffs into the lungs every 6 (six) hours as needed. Chest tightness   Yes Historical Provider, MD  ALPRAZolam Duanne Moron) 1 MG tablet Take 1 mg by mouth 4 (four) times daily.   Yes Historical Provider, MD  doxycycline (VIBRAMYCIN) 100 MG capsule Take 1 capsule (100 mg total) by mouth 2 (two) times daily. 12/12/13  Yes Sharyon Cable, MD  gabapentin (NEURONTIN) 300 MG capsule Take 300-600 mg by mouth as needed (leg pain).   Yes Historical Provider, MD  omeprazole (PRILOSEC) 20 MG capsule Take 20 mg by mouth daily.   Yes Historical Provider, MD  oxyCODONE-acetaminophen (PERCOCET/ROXICET) 5-325 MG per tablet Take 2 tablets by mouth every 4 (four) hours as needed for severe pain. 12/12/13  Yes Sharyon Cable, MD   BP 111/68  Pulse 82  Temp(Src) 98.8 F (37.1 C) (Oral)  Resp 16  Ht 5\' 4"  (1.626 m)  Wt 124 lb (56.246 kg)  BMI 21.27 kg/m2  SpO2 99% Physical Exam  Constitutional: She is oriented to person, place, and time. She  appears well-developed.  HENT:  Head: Normocephalic.  cellulitus and swelling right cheek  Eyes: Conjunctivae and EOM are normal. No scleral icterus.  Neck: Neck supple. No thyromegaly present.  Cardiovascular: Normal rate and regular rhythm.  Exam reveals no gallop and no friction rub.   No murmur heard. Pulmonary/Chest: No stridor. She has no wheezes. She has no rales. She exhibits no tenderness.  Abdominal: She exhibits no distension. There is no tenderness. There is no rebound.  Musculoskeletal: Normal range of motion. She exhibits no edema.  Lymphadenopathy:    She has no cervical adenopathy.  Neurological: She is oriented to person, place, and time. She exhibits normal muscle tone. Coordination normal.  Skin: Rash noted. No erythema.  Psychiatric: She has a normal mood and affect. Her behavior is normal.    ED Course  Procedures (including critical care time)  DIAGNOSTIC STUDIES: COORDINATION OF CARE:  Labs Review Labs Reviewed  CBC WITH DIFFERENTIAL  COMPREHENSIVE METABOLIC PANEL    Imaging Review Ct Maxillofacial W/cm  12/12/2013   CLINICAL DATA:  Right facial swelling  EXAM: CT MAXILLOFACIAL WITH CONTRAST  TECHNIQUE: Multidetector CT imaging of the maxillofacial structures was performed with intravenous contrast. Multiplanar CT image reconstructions were also generated. A small metallic BB was placed on the right temple in order to reliably differentiate right from left.  CONTRAST:  2mL OMNIPAQUE IOHEXOL 300 MG/ML  SOLN  COMPARISON:  None.  FINDINGS: Soft tissue swelling along the right side of the face along the mandible. No focal fluid collection is suggest an abscess. No bone destruction. The globes are intact. The orbital walls are intact. The orbital floors are intact. The maxilla is intact. The mandible is intact. The zygomatic arches are intact. The nasal septum is midline. There is no nasal bone fracture. The temporomandibular joints are normal.  Bilateral ethmoid  mucosal thickening. Mild left maxillary sinus mucosal thickening. The visualized portions of the mastoid sinuses are well aerated.  IMPRESSION: 1. Right facial soft tissue swelling without a drainable fluid collection. The appearance is concerning for cellulitis.   Electronically Signed   By: Kathreen Devoid   On: 12/12/2013 11:52     EKG Interpretation None      MDM   Final diagnoses:  None   The chart was scribed for me under my direct supervision.  I personally performed the history, physical, and medical decision making and all procedures in the evaluation of this patient.Marland Kitchen }     Maudry Diego, MD 12/13/13 [phone]

## 2014-02-26 ENCOUNTER — Emergency Department (HOSPITAL_COMMUNITY)
Admission: EM | Admit: 2014-02-26 | Discharge: 2014-02-26 | Discharge status: Against Medical Advice | Payer: Medicaid Other | Attending: Emergency Medicine | Admitting: Emergency Medicine

## 2014-02-26 ENCOUNTER — Encounter (HOSPITAL_COMMUNITY): Payer: Self-pay | Admitting: *Deleted

## 2014-02-26 DIAGNOSIS — S8991XA Unspecified injury of right lower leg, initial encounter: Secondary | ICD-10-CM | POA: Insufficient documentation

## 2014-02-26 DIAGNOSIS — Y92481 Parking lot as the place of occurrence of the external cause: Secondary | ICD-10-CM | POA: Diagnosis not present

## 2014-02-26 DIAGNOSIS — Y998 Other external cause status: Secondary | ICD-10-CM | POA: Diagnosis not present

## 2014-02-26 DIAGNOSIS — Z729 Problem related to lifestyle, unspecified: Secondary | ICD-10-CM | POA: Diagnosis not present

## 2014-02-26 DIAGNOSIS — Y9389 Activity, other specified: Secondary | ICD-10-CM | POA: Insufficient documentation

## 2014-02-26 DIAGNOSIS — S79912A Unspecified injury of left hip, initial encounter: Secondary | ICD-10-CM | POA: Diagnosis not present
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
## 2014-02-27 ENCOUNTER — Emergency Department (HOSPITAL_COMMUNITY): Payer: Medicaid Other

## 2014-02-27 ENCOUNTER — Encounter (HOSPITAL_COMMUNITY): Payer: Self-pay | Admitting: Emergency Medicine

## 2014-02-27 ENCOUNTER — Emergency Department (HOSPITAL_COMMUNITY)
Admission: EM | Admit: 2014-02-27 | Discharge: 2014-02-27 | Disposition: A | Discharge status: Home / Self Care | Payer: Medicaid Other | Attending: Emergency Medicine | Admitting: Emergency Medicine

## 2014-02-27 DIAGNOSIS — Z79899 Other long term (current) drug therapy: Secondary | ICD-10-CM | POA: Insufficient documentation

## 2014-02-27 DIAGNOSIS — Z720 Tobacco use: Secondary | ICD-10-CM | POA: Diagnosis not present

## 2014-02-27 DIAGNOSIS — M25552 Pain in left hip: Secondary | ICD-10-CM

## 2014-02-27 DIAGNOSIS — W19XXXA Unspecified fall, initial encounter: Secondary | ICD-10-CM

## 2014-02-27 DIAGNOSIS — T8484XA Pain due to internal orthopedic prosthetic devices, implants and grafts, initial encounter: Secondary | ICD-10-CM

## 2014-02-27 DIAGNOSIS — Y9301 Activity, walking, marching and hiking: Secondary | ICD-10-CM | POA: Insufficient documentation

## 2014-02-27 DIAGNOSIS — S299XXA Unspecified injury of thorax, initial encounter: Secondary | ICD-10-CM | POA: Diagnosis not present

## 2014-02-27 DIAGNOSIS — Z90710 Acquired absence of both cervix and uterus: Secondary | ICD-10-CM | POA: Insufficient documentation

## 2014-02-27 DIAGNOSIS — S4992XA Unspecified injury of left shoulder and upper arm, initial encounter: Secondary | ICD-10-CM | POA: Insufficient documentation

## 2014-02-27 DIAGNOSIS — Z8781 Personal history of (healed) traumatic fracture: Secondary | ICD-10-CM | POA: Insufficient documentation

## 2014-02-27 DIAGNOSIS — Z8541 Personal history of malignant neoplasm of cervix uteri: Secondary | ICD-10-CM | POA: Diagnosis not present

## 2014-02-27 DIAGNOSIS — Y92410 Unspecified street and highway as the place of occurrence of the external cause: Secondary | ICD-10-CM | POA: Diagnosis not present

## 2014-02-27 DIAGNOSIS — F419 Anxiety disorder, unspecified: Secondary | ICD-10-CM | POA: Insufficient documentation

## 2014-02-27 DIAGNOSIS — Z872 Personal history of diseases of the skin and subcutaneous tissue: Secondary | ICD-10-CM | POA: Diagnosis not present

## 2014-02-27 DIAGNOSIS — K219 Gastro-esophageal reflux disease without esophagitis: Secondary | ICD-10-CM | POA: Insufficient documentation

## 2014-02-27 DIAGNOSIS — R0789 Other chest pain: Secondary | ICD-10-CM

## 2014-02-27 DIAGNOSIS — Y998 Other external cause status: Secondary | ICD-10-CM | POA: Insufficient documentation

## 2014-02-27 DIAGNOSIS — S79912A Unspecified injury of left hip, initial encounter: Secondary | ICD-10-CM | POA: Insufficient documentation

## 2014-02-27 MED ORDER — OXYCODONE-ACETAMINOPHEN 5-325 MG PO TABS: 1.0000 | ORAL_TABLET | ORAL | Status: DC | PRN
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
## 2014-02-27 NOTE — Discharge Instructions (Signed)
As discussed you need to see Dr. Marcelino Scot to discuss if something has been done about the hardward Musculoskeletal Pain Musculoskeletal pain is muscle and boney aches and pains. These pains can occur in any part of the body. Your caregiver may treat you without knowing the cause of the pain. They may treat you if blood or urine tests, X-rays, and other tests were normal.  CAUSES There is often not a definite cause or reason for these pains. These pains may be caused by a type of germ (virus). The discomfort may also come from overuse. Overuse includes working out too hard when your body is not fit. Boney aches also come from weather changes. Bone is sensitive to atmospheric pressure changes. HOME CARE INSTRUCTIONS   Ask when your test results will be ready. Make sure you get your test results.  Only take over-the-counter or prescription medicines for pain, discomfort, or fever as directed by your caregiver. If you were given medications for your condition, do not drive, operate machinery or power tools, or sign legal documents for 24 hours. Do not drink alcohol. Do not take sleeping pills or other medications that may interfere with treatment.  Continue all activities unless the activities cause more pain. When the pain lessens, slowly resume normal activities. Gradually increase the intensity and duration of the activities or exercise.  During periods of severe pain, bed rest may be helpful. Lay or sit in any position that is comfortable.  Putting ice on the injured area.  Put ice in a bag.  Place a towel between your skin and the bag.  Leave the ice on for 15 to 20 minutes, 3 to 4 times a day.  Follow up with your caregiver for continued problems and no reason can be found for the pain. If the pain becomes worse or does not go away, it may be necessary to repeat tests or do additional testing. Your caregiver may need to look further for a possible cause. SEEK IMMEDIATE MEDICAL CARE IF:  You  have pain that is getting worse and is not relieved by medications.  You develop chest pain that is associated with shortness or breath, sweating, feeling sick to your stomach (nauseous), or throw up (vomit).  Your pain becomes localized to the abdomen.  You develop any new symptoms that seem different or that concern you. MAKE SURE YOU:   Understand these instructions.  Will watch your condition.  Will get help right away if you are not doing well or get worse. Document Released: 03/16/2005 Document Revised: 06/08/2011 Document Reviewed: 11/18/2012 [facility] Patient Information 2015 [place], [place]. This information is not intended to replace advice given to you by your health care provider. Make sure you discuss any questions you have with your health care provider.

## 2014-03-16 ENCOUNTER — Other Ambulatory Visit (HOSPITAL_COMMUNITY): Payer: Self-pay | Admitting: Orthopedic Surgery

## 2014-03-16 DIAGNOSIS — T84218A Breakdown (mechanical) of internal fixation device of other bones, initial encounter: Secondary | ICD-10-CM

## 2014-03-16 DIAGNOSIS — S3210XK Unspecified fracture of sacrum, subsequent encounter for fracture with nonunion: Secondary | ICD-10-CM

## 2014-03-20 ENCOUNTER — Ambulatory Visit (HOSPITAL_COMMUNITY)
Admission: RE | Admit: 2014-03-20 | Discharge: 2014-03-20 | Disposition: A | Discharge status: Home / Self Care | Payer: Medicaid Other | Source: Ambulatory Visit | Attending: Orthopedic Surgery | Admitting: Orthopedic Surgery

## 2014-03-20 DIAGNOSIS — Y92481 Parking lot as the place of occurrence of the external cause: Secondary | ICD-10-CM | POA: Diagnosis not present

## 2014-03-20 DIAGNOSIS — K381 Appendicular concretions: Secondary | ICD-10-CM | POA: Insufficient documentation

## 2014-03-20 DIAGNOSIS — T84218A Breakdown (mechanical) of internal fixation device of other bones, initial encounter: Secondary | ICD-10-CM

## 2014-03-20 DIAGNOSIS — M6258 Muscle wasting and atrophy, not elsewhere classified, other site: Secondary | ICD-10-CM | POA: Insufficient documentation

## 2014-03-20 DIAGNOSIS — S3210XK Unspecified fracture of sacrum, subsequent encounter for fracture with nonunion: Secondary | ICD-10-CM

## 2014-03-20 DIAGNOSIS — S32810D Multiple fractures of pelvis with stable disruption of pelvic ring, subsequent encounter for fracture with routine healing: Secondary | ICD-10-CM | POA: Insufficient documentation

## 2014-04-11 ENCOUNTER — Emergency Department (HOSPITAL_COMMUNITY)
Admission: EM | Admit: 2014-04-11 | Discharge: 2014-04-11 | Disposition: A | Discharge status: Home / Self Care | Payer: Medicaid Other | Attending: Emergency Medicine | Admitting: Emergency Medicine

## 2014-04-11 ENCOUNTER — Encounter (HOSPITAL_COMMUNITY): Payer: Self-pay | Admitting: Emergency Medicine

## 2014-04-11 ENCOUNTER — Emergency Department (HOSPITAL_COMMUNITY): Payer: Medicaid Other

## 2014-04-11 DIAGNOSIS — K219 Gastro-esophageal reflux disease without esophagitis: Secondary | ICD-10-CM | POA: Diagnosis not present

## 2014-04-11 DIAGNOSIS — R102 Pelvic and perineal pain: Secondary | ICD-10-CM | POA: Insufficient documentation

## 2014-04-11 DIAGNOSIS — Z8781 Personal history of (healed) traumatic fracture: Secondary | ICD-10-CM | POA: Insufficient documentation

## 2014-04-11 DIAGNOSIS — Z8669 Personal history of other diseases of the nervous system and sense organs: Secondary | ICD-10-CM | POA: Diagnosis not present

## 2014-04-11 DIAGNOSIS — Z9889 Other specified postprocedural states: Secondary | ICD-10-CM | POA: Diagnosis not present

## 2014-04-11 DIAGNOSIS — Z90710 Acquired absence of both cervix and uterus: Secondary | ICD-10-CM | POA: Diagnosis not present

## 2014-04-11 DIAGNOSIS — Z8542 Personal history of malignant neoplasm of other parts of uterus: Secondary | ICD-10-CM | POA: Diagnosis not present

## 2014-04-11 DIAGNOSIS — G8929 Other chronic pain: Secondary | ICD-10-CM | POA: Diagnosis not present

## 2014-04-11 DIAGNOSIS — Z87442 Personal history of urinary calculi: Secondary | ICD-10-CM | POA: Insufficient documentation

## 2014-04-11 DIAGNOSIS — R05 Cough: Secondary | ICD-10-CM

## 2014-04-11 DIAGNOSIS — Z720 Tobacco use: Secondary | ICD-10-CM | POA: Diagnosis not present

## 2014-04-11 DIAGNOSIS — Z79899 Other long term (current) drug therapy: Secondary | ICD-10-CM | POA: Insufficient documentation

## 2014-04-11 DIAGNOSIS — F419 Anxiety disorder, unspecified: Secondary | ICD-10-CM | POA: Diagnosis not present

## 2014-04-11 DIAGNOSIS — Z87440 Personal history of urinary (tract) infections: Secondary | ICD-10-CM | POA: Diagnosis not present

## 2014-04-11 DIAGNOSIS — R059 Cough, unspecified: Secondary | ICD-10-CM

## 2014-04-11 DIAGNOSIS — J029 Acute pharyngitis, unspecified: Secondary | ICD-10-CM | POA: Diagnosis not present

## 2014-04-11 MED ORDER — BENZONATATE 100 MG PO CAPS: 100.0000 mg | ORAL_CAPSULE | Freq: Three times a day (TID) | ORAL | Status: DC

## 2014-04-11 NOTE — ED Provider Notes (Signed)
CSN: [id number]     Arrival date & time 04/11/14  1326 History   First MD Initiated Contact with Patient 04/11/14 1330     Chief Complaint  Patient presents with  . Cough  . Pelvic Pain     (Consider location/radiation/quality/duration/timing/severity/associated sxs/prior Treatment) HPI Comments: Pt states that she has chronic pelvic pain related to an injury before. Pt states that she has a know fracture in the plate in her pelvis  Patient is a 29 y.o. female presenting with cough and pelvic pain. The history is provided by the patient. No language interpreter was used.  Cough Cough characteristics:  Productive Sputum characteristics:  Yellow Severity:  Moderate Onset quality:  Gradual Timing:  Constant Progression:  Unchanged Context: sick contacts   Relieved by:  Nothing Ineffective treatments:  Beta-agonist inhaler Associated symptoms: sinus congestion and sore throat   Associated symptoms: no fever   Pelvic Pain Associated symptoms include coughing and a sore throat. Pertinent negatives include no fever.    Past Medical History  Diagnosis Date  . Blood transfusion   . Pelvic fracture   . Wound of buttock   . Pain in right buttock   . Right humeral fracture   . Bell's palsy     29 yrs old  . UTI (urinary tract infection)   . GERD (gastroesophageal reflux disease)     occ  . Anxiety   . Cervical cancer 2010    cervical/ s/p total hysterectomy  . Kidney stone    Past Surgical History  Procedure Laterality Date  . Hip surgery    . Abdominal hysterectomy  2010    cervical cancer  . Ankle arthroplasty      plates not replacement  . Bony pelvis surgery      fx pelvis, fixation  . Humerus fracture surgery    . Sacro-iliac pinning  08/28/2011    Procedure: SACRO-ILIAC PINNING;  Surgeon: Rozanna Box, MD;  Location: [facility];  Service: Orthopedics;  Laterality: Left;  REPAIR OF LEFT SACRUM NONUNION  . Fracture surgery    . Fracture surgery      pelvis, rt arm    No family history on file. History  Substance Use Topics  . Smoking status: Current Some Day Smoker -- 0.50 packs/day for 12 years    Types: Cigarettes  . Smokeless tobacco: Not on file     Comment: teenager  . Alcohol Use: No   OB History    No data available     Review of Systems  Constitutional: Negative for fever.  HENT: Positive for sore throat.   Respiratory: Positive for cough.   Genitourinary: Positive for pelvic pain.  All other systems reviewed and are negative.     Allergies  Aleve; Aspirin-acetaminophen-caffeine; Doxycycline; Metronidazole; and Vancomycin  Home Medications   Prior to Admission medications   Medication Sig Start Date End Date Taking? Authorizing Provider  acetaminophen (TYLENOL) 500 MG tablet Take 1,500 mg by mouth every 6 (six) hours as needed (pain).    Yes Historical Provider, MD  albuterol (PROVENTIL HFA;VENTOLIN HFA) 108 (90 BASE) MCG/ACT inhaler Inhale 2 puffs into the lungs every 6 (six) hours as needed. Chest tightness   Yes Historical Provider, MD  ALPRAZolam Duanne Moron) 1 MG tablet Take 1 mg by mouth 3 (three) times daily.    Yes Historical Provider, MD  Aspirin-Salicylamide-Caffeine (BC HEADACHE POWDER PO) Take 1 Package by mouth daily as needed (pain).   Yes Historical Provider, MD  gabapentin (NEURONTIN)  300 MG capsule Take 300 mg by mouth 3 (three) times daily.    Yes Historical Provider, MD  ibuprofen (ADVIL,MOTRIN) 200 MG tablet Take 800 mg by mouth every 6 (six) hours as needed for moderate pain.   Yes Historical Provider, MD  omeprazole (PRILOSEC) 20 MG capsule Take 20 mg by mouth daily.   Yes Historical Provider, MD  oxyCODONE-acetaminophen (PERCOCET/ROXICET) 5-325 MG per tablet Take 1-2 tablets by mouth every 4 (four) hours as needed for moderate pain or severe pain. Patient not taking: Reported on 04/11/2014 02/27/14   Glendell Docker, NP   BP 119/75 mmHg  Pulse 122  Temp(Src) 98.3 F (36.8 C) (Oral)  Resp 20  Ht 5\' 4"   (1.626 m)  Wt 118 lb (53.524 kg)  BMI 20.24 kg/m2  SpO2 100% Physical Exam  Constitutional: She is oriented to person, place, and time. She appears well-developed and well-nourished.  HENT:  Right Ear: External ear normal.  Left Ear: External ear normal.  Mouth/Throat: Posterior oropharyngeal erythema present. No oropharyngeal exudate, posterior oropharyngeal edema or tonsillar abscesses.  Cardiovascular: Normal rate and regular rhythm.   Pulmonary/Chest: Effort normal and breath sounds normal. She has no wheezes. She has no rales.  Musculoskeletal: Normal range of motion.  Neurological: She is alert and oriented to person, place, and time.  Skin: Skin is warm and dry.  Nursing note and vitals reviewed.   ED Course  Procedures (including critical care time) Labs Review Labs Reviewed - No data to display  Imaging Review Dg Chest 2 View  04/11/2014   CLINICAL DATA:  Productive cough since 04/09/2014. Body aches for 1 day. Current smoker.  EXAM: CHEST  2 VIEW  COMPARISON:  02/27/2014  FINDINGS: The cardiomediastinal silhouette is within normal limits. The lungs are well inflated and clear. There is no evidence of pleural effusion or pneumothorax. No acute osseous abnormality is identified.  IMPRESSION: No active cardiopulmonary disease.   Electronically Signed   By: Logan Bores   On: 04/11/2014 14:21     EKG Interpretation None      MDM   Final diagnoses:  Cough  Chronic pain in female pelvis    No pneumonia noted on x-ray. Pt is to follow up as needed. Pt given tessalon for cough.discussed with pt multiple times the reason for no antibiotics. Pt is not happy with explanation    Glendell Docker, NP 04/11/14 [place], MD 04/12/14 0830

## 2014-04-11 NOTE — Discharge Instructions (Signed)
Cough, Adult  A cough is a reflex that helps clear your throat and airways. It can help heal the body or may be a reaction to an irritated airway. A cough may only last 2 or 3 weeks (acute) or may last more than 8 weeks (chronic).  CAUSES Acute cough:  Viral or bacterial infections. Chronic cough:  Infections.  Allergies.  Asthma.  Post-nasal drip.  Smoking.  Heartburn or acid reflux.  Some medicines.  Chronic lung problems (COPD).  Cancer. SYMPTOMS   Cough.  Fever.  Chest pain.  Increased breathing rate.  High-pitched whistling sound when breathing (wheezing).  Colored mucus that you cough up (sputum). TREATMENT   A bacterial cough may be treated with antibiotic medicine.  A viral cough must run its course and will not respond to antibiotics.  Your caregiver may recommend other treatments if you have a chronic cough. HOME CARE INSTRUCTIONS   Only take over-the-counter or prescription medicines for pain, discomfort, or fever as directed by your caregiver. Use cough suppressants only as directed by your caregiver.  Use a cold steam vaporizer or humidifier in your bedroom or home to help loosen secretions.  Sleep in a semi-upright position if your cough is worse at night.  Rest as needed.  Stop smoking if you smoke. SEEK IMMEDIATE MEDICAL CARE IF:   You have pus in your sputum.  Your cough starts to worsen.  You cannot control your cough with suppressants and are losing sleep.  You begin coughing up blood.  You have difficulty breathing.  You develop pain which is getting worse or is uncontrolled with medicine.  You have a fever. MAKE SURE YOU:   Understand these instructions.  Will watch your condition.  Will get help right away if you are not doing well or get worse. Document Released: 09/12/2010 Document Revised: 06/08/2011 Document Reviewed: 09/12/2010 Exit[employer] Patient Information 2015 [employer]. This information is not intended  to replace advice given to you by your health care provider. Make sure you discuss any questions you have with your health care provider.  
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
## 2014-06-03 ENCOUNTER — Emergency Department (HOSPITAL_COMMUNITY)
Admission: EM | Admit: 2014-06-03 | Discharge: 2014-06-04 | Disposition: A | Discharge status: Home / Self Care | Payer: Medicaid Other | Attending: Emergency Medicine | Admitting: Emergency Medicine

## 2014-06-03 ENCOUNTER — Encounter (HOSPITAL_COMMUNITY): Payer: Self-pay

## 2014-06-03 DIAGNOSIS — Y998 Other external cause status: Secondary | ICD-10-CM | POA: Insufficient documentation

## 2014-06-03 DIAGNOSIS — Z79899 Other long term (current) drug therapy: Secondary | ICD-10-CM | POA: Diagnosis not present

## 2014-06-03 DIAGNOSIS — S0083XA Contusion of other part of head, initial encounter: Secondary | ICD-10-CM | POA: Insufficient documentation

## 2014-06-03 DIAGNOSIS — Y92091 Bathroom in other non-institutional residence as the place of occurrence of the external cause: Secondary | ICD-10-CM | POA: Insufficient documentation

## 2014-06-03 DIAGNOSIS — F419 Anxiety disorder, unspecified: Secondary | ICD-10-CM | POA: Diagnosis not present

## 2014-06-03 DIAGNOSIS — Y9389 Activity, other specified: Secondary | ICD-10-CM | POA: Insufficient documentation

## 2014-06-03 DIAGNOSIS — K219 Gastro-esophageal reflux disease without esophagitis: Secondary | ICD-10-CM | POA: Diagnosis not present

## 2014-06-03 DIAGNOSIS — Z8781 Personal history of (healed) traumatic fracture: Secondary | ICD-10-CM | POA: Insufficient documentation

## 2014-06-03 DIAGNOSIS — S0990XA Unspecified injury of head, initial encounter: Secondary | ICD-10-CM

## 2014-06-03 DIAGNOSIS — S060X0A Concussion without loss of consciousness, initial encounter: Secondary | ICD-10-CM | POA: Insufficient documentation

## 2014-06-03 DIAGNOSIS — W01198A Fall on same level from slipping, tripping and stumbling with subsequent striking against other object, initial encounter: Secondary | ICD-10-CM | POA: Insufficient documentation

## 2014-06-03 DIAGNOSIS — Z720 Tobacco use: Secondary | ICD-10-CM | POA: Insufficient documentation

## 2014-06-03 DIAGNOSIS — S0993XA Unspecified injury of face, initial encounter: Secondary | ICD-10-CM | POA: Diagnosis present

## 2014-06-03 DIAGNOSIS — Z8541 Personal history of malignant neoplasm of cervix uteri: Secondary | ICD-10-CM | POA: Diagnosis not present

## 2014-06-03 NOTE — ED Notes (Signed)
Lost her balance in bathroom last night and fell, hitting her left side of face on toilet, vague about possible loss of consciousness. Was home alone at the time. Now feels sleepy and is worried about possible head injury

## 2014-06-03 NOTE — ED Provider Notes (Signed)
CSN: [id number]     Arrival date & time 06/03/14  2241 History  This chart was scribed for Hoy Morn, MD by Edison Simon, ED Scribe. This patient was seen in room APA12/[facility]12 and the patient's care was started at 11:46 PM.    Chief Complaint  Patient presents with  . Fall   The history is provided by the patient. No language interpreter was used.    HPI Comments: [name] is a 29 y.o. female who presents to the Emergency Department complaining of injury to left cheek after falling and striking it on toilet last night. She notes her balance is poor after surgeries. She is unsure about LOC. She notes she has felt drowsy today. She states she used to be on Coumadin and Lovenox after blood transfusions but has not used them since 2012. She denies dental problem or neck pain.  Past Medical History  Diagnosis Date  . Blood transfusion   . Pelvic fracture   . Wound of buttock   . Pain in right buttock   . Right humeral fracture   . Bell's palsy     29 yrs old  . UTI (urinary tract infection)   . GERD (gastroesophageal reflux disease)     occ  . Anxiety   . Cervical cancer 2010    cervical/ s/p total hysterectomy  . Kidney stone    Past Surgical History  Procedure Laterality Date  . Hip surgery    . Abdominal hysterectomy  2010    cervical cancer  . Ankle arthroplasty      plates not replacement  . Bony pelvis surgery      fx pelvis, fixation  . Humerus fracture surgery    . Sacro-iliac pinning  08/28/2011    Procedure: SACRO-ILIAC PINNING;  Surgeon: Rozanna Box, MD;  Location: [facility];  Service: Orthopedics;  Laterality: Left;  REPAIR OF LEFT SACRUM NONUNION  . Fracture surgery    . Fracture surgery      pelvis, rt arm   No family history on file. History  Substance Use Topics  . Smoking status: Current Some Day Smoker -- 0.50 packs/day for 12 years    Types: Cigarettes  . Smokeless tobacco: Not on file     Comment: teenager  . Alcohol Use: No   OB History     No data available     Review of Systems A complete 10 system review of systems was obtained and all systems are negative except as noted in the HPI and PMH.    Allergies  Aleve; Aspirin-acetaminophen-caffeine; Doxycycline; Metronidazole; and Vancomycin  Home Medications   Prior to Admission medications   Medication Sig Start Date End Date Taking? Authorizing Provider  acetaminophen (TYLENOL) 500 MG tablet Take 1,500 mg by mouth every 6 (six) hours as needed (pain).     Historical Provider, MD  albuterol (PROVENTIL HFA;VENTOLIN HFA) 108 (90 BASE) MCG/ACT inhaler Inhale 2 puffs into the lungs every 6 (six) hours as needed. Chest tightness    Historical Provider, MD  ALPRAZolam Duanne Moron) 1 MG tablet Take 1 mg by mouth 3 (three) times daily.     Historical Provider, MD  Aspirin-Salicylamide-Caffeine (BC HEADACHE POWDER PO) Take 1 Package by mouth daily as needed (pain).    Historical Provider, MD  benzonatate (TESSALON) 100 MG capsule Take 1 capsule (100 mg total) by mouth every 8 (eight) hours. 04/11/14   Glendell Docker, NP  gabapentin (NEURONTIN) 300 MG capsule Take 300 mg by  mouth 3 (three) times daily.     Historical Provider, MD  ibuprofen (ADVIL,MOTRIN) 200 MG tablet Take 800 mg by mouth every 6 (six) hours as needed for moderate pain.    Historical Provider, MD  omeprazole (PRILOSEC) 20 MG capsule Take 20 mg by mouth daily.    Historical Provider, MD  oxyCODONE-acetaminophen (PERCOCET/ROXICET) 5-325 MG per tablet Take 1-2 tablets by mouth every 4 (four) hours as needed for moderate pain or severe pain. Patient not taking: Reported on 04/11/2014 02/27/14   Glendell Docker, NP   BP 134/91 mmHg  Pulse 79  Temp(Src) 98.7 F (37.1 C) (Oral)  Resp 20  Ht 5\' 4"  (1.626 m)  Wt 120 lb (54.432 kg)  BMI 20.59 kg/m2  SpO2 100% Physical Exam  Constitutional: She is oriented to person, place, and time. She appears well-developed and well-nourished.  HENT:  Head: Normocephalic.  Tenderness over  left maxillary sinus, mild swelling No trismus or malocclusion  Eyes: EOM are normal.  Neck: Normal range of motion.  No cervical spine tenderness  Pulmonary/Chest: Effort normal.  Abdominal: She exhibits no distension.  Musculoskeletal: Normal range of motion.  Neurological: She is alert and oriented to person, place, and time.  Psychiatric: She has a normal mood and affect.  Nursing note and vitals reviewed.   ED Course  Procedures (including critical care time)  DIAGNOSTIC STUDIES: Oxygen Saturation is 100% on room air, normal by my interpretation.    COORDINATION OF CARE: 11:49 PM Discussed treatment plan with patient at beside, including head CT. The patient agrees with the plan and has no further questions at this time.   Labs Review Labs Reviewed - No data to display  Imaging Review Ct Head Wo Contrast  06/04/2014   CLINICAL DATA:  Lost balance in bathroom last night, hitting head on toilet, LEFT facial pain, sleepiness and headache.  EXAM: CT HEAD WITHOUT CONTRAST  CT MAXILLOFACIAL WITHOUT CONTRAST  TECHNIQUE: Multidetector CT imaging of the head and maxillofacial structures were performed using the standard protocol without intravenous contrast. Multiplanar CT image reconstructions of the maxillofacial structures were also generated.  COMPARISON:  CT of the face December 12, 2013 and CT of the head February 07, 2013.  FINDINGS: CT HEAD FINDINGS  The ventricles and sulci are normal. No intraparenchymal hemorrhage, mass effect nor midline shift. No acute large vascular territory infarcts.  No abnormal extra-axial fluid collections. Basal cisterns are patent. No skull fracture.  CT MAXILLOFACIAL FINDINGS  Mandible is intact, the condyles are located. No acute facial fracture. Small bilateral periapical lucencies, probable recent maxillary teeth extraction. Nasal septum is slightly deviated to the RIGHT. Mild paranasal sinus mucosal thickening without air-fluid levels. No destructive  bony lesions.  Ocular globes and orbital contents are unremarkable. Mild LEFT facial subcutaneous fat stranding without focal fluid collection, radiopaque foreign bodies or subcutaneous gas.  IMPRESSION: CT HEAD: No acute intracranial process ; normal noncontrast CT of the head.  CT MAXILLOFACIAL: LEFT facial subcutaneous fat stranding/contusion without acute facial fracture.   Electronically Signed   By: Elon Alas   On: 06/04/2014 01:12   Ct Maxillofacial Wo Cm  06/04/2014   CLINICAL DATA:  Lost balance in bathroom last night, hitting head on toilet, LEFT facial pain, sleepiness and headache.  EXAM: CT HEAD WITHOUT CONTRAST  CT MAXILLOFACIAL WITHOUT CONTRAST  TECHNIQUE: Multidetector CT imaging of the head and maxillofacial structures were performed using the standard protocol without intravenous contrast. Multiplanar CT image reconstructions of the maxillofacial structures were  also generated.  COMPARISON:  CT of the face December 12, 2013 and CT of the head February 07, 2013.  FINDINGS: CT HEAD FINDINGS  The ventricles and sulci are normal. No intraparenchymal hemorrhage, mass effect nor midline shift. No acute large vascular territory infarcts.  No abnormal extra-axial fluid collections. Basal cisterns are patent. No skull fracture.  CT MAXILLOFACIAL FINDINGS  Mandible is intact, the condyles are located. No acute facial fracture. Small bilateral periapical lucencies, probable recent maxillary teeth extraction. Nasal septum is slightly deviated to the RIGHT. Mild paranasal sinus mucosal thickening without air-fluid levels. No destructive bony lesions.  Ocular globes and orbital contents are unremarkable. Mild LEFT facial subcutaneous fat stranding without focal fluid collection, radiopaque foreign bodies or subcutaneous gas.  IMPRESSION: CT HEAD: No acute intracranial process ; normal noncontrast CT of the head.  CT MAXILLOFACIAL: LEFT facial subcutaneous fat stranding/contusion without acute facial  fracture.   Electronically Signed   By: Elon Alas   On: 06/04/2014 01:12     EKG Interpretation None      MDM   Final diagnoses:  None    CT imaging negative.  Likely mild concussion.  C-spine is cleared by Nexus criteria.  No other complaints.  Overall well-appearing.  Vital signs are normal.  Discharge home in good condition.  I personally performed the services described in this documentation, which was scribed in my presence. The recorded information has been reviewed and is accurate.      Hoy Morn, MD 06/04/14 [phone]

## 2014-06-04 ENCOUNTER — Emergency Department (HOSPITAL_COMMUNITY): Payer: Medicaid Other

## 2014-06-04 MED ORDER — ACETAMINOPHEN 500 MG PO TABS: 500.0000 mg | ORAL_TABLET | Freq: Four times a day (QID) | ORAL | Status: AC | PRN

## 2014-06-04 NOTE — Discharge Instructions (Signed)
Concussion  A concussion, or closed-head injury, is a brain injury caused by a direct blow to the head or by a quick and sudden movement (jolt) of the head or neck. Concussions are usually not life-threatening. Even so, the effects of a concussion can be serious. If you have had a concussion before, you are more likely to experience concussion-like symptoms after a direct blow to the head.   CAUSES  · Direct blow to the head, such as from running into another player during a soccer game, being hit in a fight, or hitting your head on a hard surface.  · A jolt of the head or neck that causes the brain to move back and forth inside the skull, such as in a car crash.  SIGNS AND SYMPTOMS  The signs of a concussion can be hard to notice. Early on, they may be missed by you, family members, and health care providers. You may look fine but act or feel differently.  Symptoms are usually temporary, but they may last for days, weeks, or even longer. Some symptoms may appear right away while others may not show up for hours or days. Every head injury is different. Symptoms include:  · Mild to moderate headaches that will not go away.  · A feeling of pressure inside your head.  · Having more trouble than usual:  ¨ Learning or remembering things you have heard.  ¨ Answering questions.  ¨ Paying attention or concentrating.  ¨ Organizing daily tasks.  ¨ Making decisions and solving problems.  · Slowness in thinking, acting or reacting, speaking, or reading.  · Getting lost or being easily confused.  · Feeling tired all the time or lacking energy (fatigued).  · Feeling drowsy.  · Sleep disturbances.  ¨ Sleeping more than usual.  ¨ Sleeping less than usual.  ¨ Trouble falling asleep.  ¨ Trouble sleeping (insomnia).  · Loss of balance or feeling lightheaded or dizzy.  · Nausea or vomiting.  · Numbness or tingling.  · Increased sensitivity to:  ¨ Sounds.  ¨ Lights.  ¨ Distractions.  · Vision problems or eyes that tire  easily.  · Diminished sense of taste or smell.  · Ringing in the ears.  · Mood changes such as feeling sad or anxious.  · Becoming easily irritated or angry for little or no reason.  · Lack of motivation.  · Seeing or hearing things other people do not see or hear (hallucinations).  DIAGNOSIS  Your health care provider can usually diagnose a concussion based on a description of your injury and symptoms. He or she will ask whether you passed out (lost consciousness) and whether you are having trouble remembering events that happened right before and during your injury.  Your evaluation might include:  · A brain scan to look for signs of injury to the brain. Even if the test shows no injury, you may still have a concussion.  · Blood tests to be sure other problems are not present.  TREATMENT  · Concussions are usually treated in an emergency department, in urgent care, or at a clinic. You may need to stay in the hospital overnight for further treatment.  · Tell your health care provider if you are taking any medicines, including prescription medicines, over-the-counter medicines, and natural remedies. Some medicines, such as blood thinners (anticoagulants) and aspirin, may increase the chance of complications. Also tell your health care provider whether you have had alcohol or are taking illegal drugs. This information   may affect treatment.  · Your health care provider will send you home with important instructions to follow.  · How fast you will recover from a concussion depends on many factors. These factors include how severe your concussion is, what part of your brain was injured, your age, and how healthy you were before the concussion.  · Most people with mild injuries recover fully. Recovery can take time. In general, recovery is slower in older persons. Also, persons who have had a concussion in the past or have other medical problems may find that it takes longer to recover from their current injury.  HOME  CARE INSTRUCTIONS  General Instructions  · Carefully follow the directions your health care provider gave you.  · Only take over-the-counter or prescription medicines for pain, discomfort, or fever as directed by your health care provider.  · Take only those medicines that your health care provider has approved.  · Do not drink alcohol until your health care provider says you are well enough to do so. Alcohol and certain other drugs may slow your recovery and can put you at risk of further injury.  · If it is harder than usual to remember things, write them down.  · If you are easily distracted, try to do one thing at a time. For example, do not try to watch TV while fixing dinner.  · Talk with family members or close friends when making important decisions.  · Keep all follow-up appointments. Repeated evaluation of your symptoms is recommended for your recovery.  · Watch your symptoms and tell others to do the same. Complications sometimes occur after a concussion. Older adults with a brain injury may have a higher risk of serious complications, such as a blood clot on the brain.  · Tell your teachers, school nurse, school counselor, coach, athletic trainer, or work manager about your injury, symptoms, and restrictions. Tell them about what you can or cannot do. They should watch for:  ¨ Increased problems with attention or concentration.  ¨ Increased difficulty remembering or learning new information.  ¨ Increased time needed to complete tasks or assignments.  ¨ Increased irritability or decreased ability to cope with stress.  ¨ Increased symptoms.  · Rest. Rest helps the brain to heal. Make sure you:  ¨ Get plenty of sleep at night. Avoid staying up late at night.  ¨ Keep the same bedtime hours on weekends and weekdays.  ¨ Rest during the day. Take daytime naps or rest breaks when you feel tired.  · Limit activities that require a lot of thought or concentration. These include:  ¨ Doing homework or job-related  work.  ¨ Watching TV.  ¨ Working on the computer.  · Avoid any situation where there is potential for another head injury (football, hockey, soccer, basketball, martial arts, downhill snow sports and horseback riding). Your condition will get worse every time you experience a concussion. You should avoid these activities until you are evaluated by the appropriate follow-up health care providers.  Returning To Your Regular Activities  You will need to return to your normal activities slowly, not all at once. You must give your body and brain enough time for recovery.  · Do not return to sports or other athletic activities until your health care provider tells you it is safe to do so.  · Ask your health care provider when you can drive, ride a bicycle, or operate heavy machinery. Your ability to react may be slower after a   brain injury. Never do these activities if you are dizzy.  · Ask your health care provider about when you can return to work or school.  Preventing Another Concussion  It is very important to avoid another brain injury, especially before you have recovered. In rare cases, another injury can lead to permanent brain damage, brain swelling, or death. The risk of this is greatest during the first 7-10 days after a head injury. Avoid injuries by:  · Wearing a seat belt when riding in a car.  · Drinking alcohol only in moderation.  · Wearing a helmet when biking, skiing, skateboarding, skating, or doing similar activities.  · Avoiding activities that could lead to a second concussion, such as contact or recreational sports, until your health care provider says it is okay.  · Taking safety measures in your home.  ¨ Remove clutter and tripping hazards from floors and stairways.  ¨ Use grab bars in bathrooms and handrails by stairs.  ¨ Place non-slip mats on floors and in bathtubs.  ¨ Improve lighting in dim areas.  SEEK MEDICAL CARE IF:  · You have increased problems paying attention or  concentrating.  · You have increased difficulty remembering or learning new information.  · You need more time to complete tasks or assignments than before.  · You have increased irritability or decreased ability to cope with stress.  · You have more symptoms than before.  Seek medical care if you have any of the following symptoms for more than 2 weeks after your injury:  · Lasting (chronic) headaches.  · Dizziness or balance problems.  · Nausea.  · Vision problems.  · Increased sensitivity to noise or light.  · Depression or mood swings.  · Anxiety or irritability.  · Memory problems.  · Difficulty concentrating or paying attention.  · Sleep problems.  · Feeling tired all the time.  SEEK IMMEDIATE MEDICAL CARE IF:  · You have severe or worsening headaches. These may be a sign of a blood clot in the brain.  · You have weakness (even if only in one hand, leg, or part of the face).  · You have numbness.  · You have decreased coordination.  · You vomit repeatedly.  · You have increased sleepiness.  · One pupil is larger than the other.  · You have convulsions.  · You have slurred speech.  · You have increased confusion. This may be a sign of a blood clot in the brain.  · You have increased restlessness, agitation, or irritability.  · You are unable to recognize people or places.  · You have neck pain.  · It is difficult to wake you up.  · You have unusual behavior changes.  · You lose consciousness.  MAKE SURE YOU:  · Understand these instructions.  · Will watch your condition.  · Will get help right away if you are not doing well or get worse.  Document Released: 06/06/2003 Document Revised: 03/21/2013 Document Reviewed: 10/06/2012  Exit[employer]® Patient Information ©2015 [employer]. This information is not intended to replace advice given to you by your health care provider. Make sure you discuss any questions you have with your health care provider.

## 2016-10-05 ENCOUNTER — Emergency Department (HOSPITAL_COMMUNITY)
Admission: EM | Admit: 2016-10-05 | Discharge: 2016-10-05 | Disposition: A | Discharge status: Home / Self Care | Payer: Medicaid Other | Attending: Physician Assistant | Admitting: Physician Assistant

## 2016-10-05 ENCOUNTER — Encounter (HOSPITAL_COMMUNITY): Payer: Self-pay

## 2016-10-05 DIAGNOSIS — G510 Bell's palsy: Secondary | ICD-10-CM | POA: Diagnosis not present

## 2016-10-05 DIAGNOSIS — F17210 Nicotine dependence, cigarettes, uncomplicated: Secondary | ICD-10-CM | POA: Diagnosis not present

## 2016-10-05 DIAGNOSIS — S90811A Abrasion, right foot, initial encounter: Secondary | ICD-10-CM | POA: Insufficient documentation

## 2016-10-05 DIAGNOSIS — Y9389 Activity, other specified: Secondary | ICD-10-CM | POA: Diagnosis not present

## 2016-10-05 DIAGNOSIS — F419 Anxiety disorder, unspecified: Secondary | ICD-10-CM | POA: Insufficient documentation

## 2016-10-05 DIAGNOSIS — T22011A Burn of unspecified degree of right forearm, initial encounter: Secondary | ICD-10-CM | POA: Diagnosis not present

## 2016-10-05 DIAGNOSIS — Z7982 Long term (current) use of aspirin: Secondary | ICD-10-CM | POA: Diagnosis not present

## 2016-10-05 DIAGNOSIS — Z8541 Personal history of malignant neoplasm of cervix uteri: Secondary | ICD-10-CM | POA: Diagnosis not present

## 2016-10-05 DIAGNOSIS — Y998 Other external cause status: Secondary | ICD-10-CM | POA: Insufficient documentation

## 2016-10-05 DIAGNOSIS — Y92410 Unspecified street and highway as the place of occurrence of the external cause: Secondary | ICD-10-CM | POA: Insufficient documentation

## 2016-10-05 DIAGNOSIS — Z79899 Other long term (current) drug therapy: Secondary | ICD-10-CM | POA: Insufficient documentation

## 2016-10-05 MED ORDER — CYCLOBENZAPRINE HCL 10 MG PO TABS: 10.0000 mg | ORAL_TABLET | Freq: Two times a day (BID) | ORAL | 0 refills | Status: DC | PRN

## 2016-10-05 MED ORDER — BACITRACIN ZINC 500 UNIT/GM EX OINT
TOPICAL_OINTMENT | Freq: Two times a day (BID) | CUTANEOUS | Status: DC
  Administered 2016-10-05: 1 via TOPICAL
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
## 2017-04-19 ENCOUNTER — Encounter: Payer: Self-pay | Admitting: Internal Medicine

## 2017-06-09 ENCOUNTER — Encounter: Payer: Self-pay | Admitting: Gastroenterology

## 2017-06-09 ENCOUNTER — Ambulatory Visit: Payer: Self-pay | Admitting: Gastroenterology

## 2017-06-09 ENCOUNTER — Telehealth: Payer: Self-pay | Admitting: Gastroenterology

## 2017-06-09 NOTE — Telephone Encounter (Signed)
PATIENT WAS A NO SHOW AND LETTER SENT  °

## 2017-08-31 ENCOUNTER — Telehealth: Payer: Self-pay | Admitting: Gastroenterology

## 2017-08-31 ENCOUNTER — Encounter: Payer: Self-pay | Admitting: Internal Medicine

## 2017-08-31 ENCOUNTER — Ambulatory Visit: Payer: Self-pay | Admitting: Gastroenterology

## 2017-08-31 NOTE — Telephone Encounter (Signed)
PATIENT WAS A NO SHOW AND LETTER SENT  °

## 2017-12-01 ENCOUNTER — Telehealth: Payer: Self-pay | Admitting: Internal Medicine

## 2017-12-01 ENCOUNTER — Telehealth: Payer: Self-pay | Admitting: Gastroenterology

## 2017-12-01 ENCOUNTER — Ambulatory Visit: Payer: Self-pay | Admitting: Gastroenterology

## 2017-12-01 NOTE — Telephone Encounter (Signed)
NEW PATIENT SCHEDULED TODAY AND CAME IN LATE 20 MINUTES FOR APPOINTMENT, HAS BEEN A NO SHOW 2 TIMES PRIOR. TOLD HER WE HAD AN OPENING Friday AND SHE SAID "fORGET IT" SHE HAS ANOTHER APPOINTMENT.  DID NOT WANT TO BE RESCHEDULED FOR NOV/DEC.  SAID SHE CAN'T WAIT THAT LONG

## 2017-12-01 NOTE — Telephone Encounter (Signed)
Vicente Males from [facility] called to say that patient was at their office upset because we didn't see her today. I told her that we received referral in January and she has no showed twice and was 10 minutes late for today's appointment. We offered her OV for this Friday, but she refused. Vicente Males from PCP office said that patient decided to go somewhere else.

## 2017-12-01 NOTE — Telephone Encounter (Signed)
She can call when ready to be scheduled. She has yet to establish care with this practice and no-showed twice already (June 09, 2017 and August 31, 2017). I'm sorry she is unable to make it Friday.

## 2017-12-01 NOTE — Telephone Encounter (Signed)
Reviewed

## 2017-12-02 NOTE — Telephone Encounter (Signed)
Noted. [employer] as FYI.

## 2017-12-02 NOTE — Telephone Encounter (Signed)
Noted  

## 2017-12-03 ENCOUNTER — Ambulatory Visit: Payer: Medicaid Other | Admitting: Gastroenterology

## 2018-03-08 ENCOUNTER — Encounter: Payer: Self-pay | Admitting: Internal Medicine

## 2018-03-24 ENCOUNTER — Encounter (INDEPENDENT_AMBULATORY_CARE_PROVIDER_SITE_OTHER): Payer: Medicaid Other | Admitting: Internal Medicine

## 2018-04-12 ENCOUNTER — Encounter (INDEPENDENT_AMBULATORY_CARE_PROVIDER_SITE_OTHER): Payer: Self-pay | Admitting: Internal Medicine

## 2018-04-12 ENCOUNTER — Ambulatory Visit (INDEPENDENT_AMBULATORY_CARE_PROVIDER_SITE_OTHER): Payer: Medicaid Other | Admitting: Internal Medicine

## 2018-04-12 VITALS — BP 122/88 | HR 87 | Temp 98.2°F | Ht 64.0 in | Wt 120.7 lb

## 2018-04-12 DIAGNOSIS — B1711 Acute hepatitis C with hepatic coma: Secondary | ICD-10-CM

## 2018-04-12 DIAGNOSIS — B1920 Unspecified viral hepatitis C without hepatic coma: Secondary | ICD-10-CM

## 2018-04-12 HISTORY — DX: Unspecified viral hepatitis C without hepatic coma: B19.20

## 2018-04-12 NOTE — Progress Notes (Signed)
Subjective:    Patient ID: [name], female    DOB: 01-26-1986, 33 y.o.   MRN: [id number]  HPI Referred by Dr Manuella Ghazi for Hepatitis C. Hx of IV drug abuse. No IV drugs since 2017. Has multiple tattoos (70 or 80).  Doing marijuana now for the nausea. Maintained on Suboxone for her addiction. Her appetite is good. No weight loss. BMs are normal.  No melena or BRRB.     07/31/2017 Hep C antibody + 07/30/2017 Hep C genotype: unable to obtain. Hep C virus RNA insufficient to obtain genotype.  HIV non reactive.   Review of Systems Past Medical History:  Diagnosis Date  . Anxiety   . Bell's palsy    33 yrs old  . Blood transfusion   . Cervical cancer ([place]) 2010   cervical/ s/p total hysterectomy  . GERD (gastroesophageal reflux disease)    occ  . Kidney stone   . Pain in right buttock   . Pelvic fracture ([place])   . Right humeral fracture   . UTI (urinary tract infection)   . Wound of buttock     Past Surgical History:  Procedure Laterality Date  . ABDOMINAL HYSTERECTOMY  2010   cervical cancer  . ANKLE ARTHROPLASTY     plates not replacement  . BONY PELVIS SURGERY     fx pelvis, fixation  . FRACTURE SURGERY    . FRACTURE SURGERY     pelvis, rt arm  . HIP SURGERY    . HUMERUS FRACTURE SURGERY    . SACRO-ILIAC PINNING  08/28/2011   Procedure: Dub Mikes;  Surgeon: Rozanna Box, MD;  Location: [facility];  Service: Orthopedics;  Laterality: Left;  REPAIR OF LEFT SACRUM NONUNION    Allergies  Allergen Reactions  . Aleve [Naproxen Sodium] Shortness Of Breath  . Aspirin-Acetaminophen-Caffeine Hives    EXCEDRIN  . Doxycycline Swelling and Other (See Comments)    Made her feel like she was burning on the inside  . Metronidazole Hives  . Vancomycin Itching    Current Outpatient Medications on File Prior to Visit  Medication Sig Dispense Refill  . albuterol (PROVENTIL HFA;VENTOLIN HFA) 108 (90 BASE) MCG/ACT inhaler Inhale 2 puffs into the lungs every 6 (six) hours  as needed. Chest tightness    . ALPRAZolam (XANAX) 1 MG tablet Take 1 mg by mouth 3 (three) times daily.     . Aspirin-Salicylamide-Caffeine (BC HEADACHE POWDER PO) Take 1 Package by mouth daily as needed (pain).    . Buprenorphine HCl-Naloxone HCl (SUBOXONE) 8-2 MG FILM Place under the tongue 3 (three) times daily.    Marland Kitchen gabapentin (NEURONTIN) 300 MG capsule Take 300 mg by mouth 3 (three) times daily.     . hydrOXYzine (ATARAX/VISTARIL) 25 MG tablet Take 25 mg by mouth 3 (three) times daily.    Marland Kitchen ibuprofen (ADVIL,MOTRIN) 200 MG tablet Take 800 mg by mouth every 6 (six) hours as needed for moderate pain.    Marland Kitchen omeprazole (PRILOSEC) 20 MG capsule Take 20 mg by mouth daily.    Marland Kitchen acetaminophen (TYLENOL) 500 MG tablet Take 1 tablet (500 mg total) by mouth every 6 (six) hours as needed (pain). 30 tablet 0   No current facility-administered medications on file prior to visit.         Objective:   Physical Exam Blood pressure 122/88, pulse 87, temperature 98.2 F (36.8 C), height 5\' 4"  (1.626 m), weight 120 lb 11.2 oz (54.7 kg). Alert and oriented. Skin  warm and dry. Oral mucosa is moist.   . Sclera anicteric, conjunctivae is pink. Thyroid not enlarged. No cervical lymphadenopathy. Lungs clear. Heart regular rate and rhythm.  Abdomen is soft. Bowel sounds are positive. No hepatomegaly. No abdominal masses felt. No tenderness.  No edema to lower extremities.          Assessment & Plan:  Hep C. Am going to get Acute Hepatitis Panel.  Hep C quaint and genotype. Will get further labs if Hep C quaint is positive.  Further recommendations to follow.

## 2018-04-12 NOTE — Patient Instructions (Signed)
Labs today    Further recommendations to follow

## 2018-04-14 LAB — HEPATIC FUNCTION PANEL
AG Ratio: 1.4 (calc) (ref 1.0–2.5)
ALT: 22 U/L (ref 6–29)
AST: 21 U/L (ref 10–30)
Albumin: 4.2 g/dL (ref 3.6–5.1)
Alkaline phosphatase (APISO): 91 U/L (ref 33–115)
Bilirubin, Direct: 0.1 mg/dL (ref 0.0–0.2)
Globulin: 3 g/dL (calc) (ref 1.9–3.7)
Indirect Bilirubin: 0.4 mg/dL (calc) (ref 0.2–1.2)
Total Bilirubin: 0.5 mg/dL (ref 0.2–1.2)
Total Protein: 7.2 g/dL (ref 6.1–8.1)

## 2018-04-14 LAB — HEPATITIS PANEL, ACUTE
Hep A IgM: NONREACTIVE
Hep B C IgM: NONREACTIVE
Hepatitis B Surface Ag: NONREACTIVE
Hepatitis C Ab: REACTIVE — AB
SIGNAL TO CUT-OFF: 35 — ABNORMAL HIGH (ref <1.00)

## 2018-04-14 LAB — HEPATITIS C GENOTYPE

## 2018-04-14 LAB — HEPATITIS C RNA QUANTITATIVE
HCV Quantitative Log: 2.55 Log IU/mL — ABNORMAL HIGH
HCV RNA, PCR, QN: 357 IU/mL — ABNORMAL HIGH

## 2018-04-18 ENCOUNTER — Telehealth (INDEPENDENT_AMBULATORY_CARE_PROVIDER_SITE_OTHER): Payer: Self-pay | Admitting: Internal Medicine

## 2018-04-18 NOTE — Telephone Encounter (Signed)
Patient called for lab results.

## 2018-04-19 ENCOUNTER — Telehealth (INDEPENDENT_AMBULATORY_CARE_PROVIDER_SITE_OTHER): Payer: Self-pay | Admitting: Internal Medicine

## 2018-04-19 DIAGNOSIS — B1710 Acute hepatitis C without hepatic coma: Secondary | ICD-10-CM

## 2018-04-19 NOTE — Telephone Encounter (Signed)
[place] sch'd 04/25/18 at [phone] ([phone]), npo after midnight, patient aware

## 2018-04-19 NOTE — Telephone Encounter (Signed)
Ann, US abdomen with elast.

## 2018-04-19 NOTE — Telephone Encounter (Signed)
I have spoken with patient. Labs have been ordered. CBC, INR/PT, AFP, urine drug screen, US abdomen/elast.

## 2018-04-19 NOTE — Telephone Encounter (Signed)
Labs ordered.

## 2018-04-25 ENCOUNTER — Ambulatory Visit (HOSPITAL_COMMUNITY): Payer: Medicaid Other

## 2018-04-26 LAB — CBC WITH DIFFERENTIAL/PLATELET
Absolute Monocytes: 326 cells/uL (ref 200–950)
Basophils Absolute: 163 cells/uL (ref 0–200)
Basophils Relative: 1.6 %
Eosinophils Absolute: 326 cells/uL (ref 15–500)
Eosinophils Relative: 3.2 %
HCT: 42.8 % (ref 35.0–45.0)
Hemoglobin: 14.6 g/dL (ref 11.7–15.5)
Lymphs Abs: 3968 cells/uL — ABNORMAL HIGH (ref 850–3900)
MCH: 31.8 pg (ref 27.0–33.0)
MCHC: 34.1 g/dL (ref 32.0–36.0)
MCV: 93.2 fL (ref 80.0–100.0)
MPV: 11.1 fL (ref 7.5–12.5)
Monocytes Relative: 3.2 %
Neutro Abs: 5416 cells/uL (ref 1500–7800)
Neutrophils Relative %: 53.1 %
Platelets: 303 10*3/uL (ref 140–400)
RBC: 4.59 10*6/uL (ref 3.80–5.10)
RDW: 12.3 % (ref 11.0–15.0)
Total Lymphocyte: 38.9 %
WBC: 10.2 10*3/uL (ref 3.8–10.8)

## 2018-04-26 LAB — PROTIME-INR
INR: 1
Prothrombin Time: 10.3 s (ref 9.0–11.5)

## 2018-04-26 LAB — AFP TUMOR MARKER: AFP-Tumor Marker: 2.5 ng/mL

## 2019-04-03 ENCOUNTER — Encounter (INDEPENDENT_AMBULATORY_CARE_PROVIDER_SITE_OTHER): Payer: Medicaid Other | Admitting: Gastroenterology

## 2019-04-13 ENCOUNTER — Encounter (INDEPENDENT_AMBULATORY_CARE_PROVIDER_SITE_OTHER): Payer: Medicaid Other | Admitting: Gastroenterology

## 2019-04-13 ENCOUNTER — Telehealth (INDEPENDENT_AMBULATORY_CARE_PROVIDER_SITE_OTHER): Payer: Self-pay | Admitting: Gastroenterology

## 2019-04-13 NOTE — Telephone Encounter (Signed)
Patient called for an appointment - was scheduled for 1/27 - wants to know if she needs to do blood work before coming in - please advise - [phone]

## 2019-04-14 NOTE — Telephone Encounter (Signed)
No we will discuss her care and drawn labs that day as needed. Thanks.

## 2019-04-26 ENCOUNTER — Encounter (INDEPENDENT_AMBULATORY_CARE_PROVIDER_SITE_OTHER): Payer: Medicaid Other | Admitting: Gastroenterology

## 2020-01-19 ENCOUNTER — Other Ambulatory Visit: Payer: Self-pay

## 2020-01-19 ENCOUNTER — Ambulatory Visit (HOSPITAL_COMMUNITY)
Admission: RE | Admit: 2020-01-19 | Discharge: 2020-01-19 | Disposition: A | Discharge status: Home / Self Care | Payer: Medicaid Other | Source: Ambulatory Visit | Attending: Otolaryngology | Admitting: Otolaryngology

## 2020-01-19 ENCOUNTER — Other Ambulatory Visit (HOSPITAL_COMMUNITY): Payer: Self-pay | Admitting: Otolaryngology

## 2020-01-19 DIAGNOSIS — R221 Localized swelling, mass and lump, neck: Secondary | ICD-10-CM | POA: Insufficient documentation

## 2020-01-19 MED ORDER — IOHEXOL 300 MG/ML  SOLN
75.0000 mL | Freq: Once | INTRAMUSCULAR | Status: AC | PRN
  Administered 2020-01-19: 75 mL via INTRAVENOUS
